# Patient Record
Sex: Female | Born: 1960 | Race: White | Hispanic: No | Marital: Single | Smoking: Never smoker
Health system: Southern US, Community
[De-identification: ages and names within clinical notes are randomized; demographics above are authoritative.]

## PROBLEM LIST (undated history)

## (undated) DIAGNOSIS — N3281 Overactive bladder: Secondary | ICD-10-CM

## (undated) DIAGNOSIS — M069 Rheumatoid arthritis, unspecified: Secondary | ICD-10-CM

## (undated) DIAGNOSIS — Z8719 Personal history of other diseases of the digestive system: Secondary | ICD-10-CM

## (undated) HISTORY — PX: ABDOMINAL HYSTERECTOMY: SHX81

## (undated) HISTORY — PX: CHOLECYSTECTOMY: SHX55

## (undated) HISTORY — DX: Rheumatoid arthritis, unspecified: M06.9

## (undated) HISTORY — DX: Personal history of other diseases of the digestive system: Z87.19

## (undated) HISTORY — DX: Overactive bladder: N32.81

---

## 2016-01-14 ENCOUNTER — Other Ambulatory Visit: Payer: Self-pay | Admitting: Rheumatology

## 2016-01-14 DIAGNOSIS — Z79899 Other long term (current) drug therapy: Secondary | ICD-10-CM

## 2016-01-15 LAB — CBC WITH DIFFERENTIAL/PLATELET
BASOS PCT: 1 %
Basophils Absolute: 64 cells/uL (ref 0–200)
EOS PCT: 3 %
Eosinophils Absolute: 192 cells/uL (ref 15–500)
HEMATOCRIT: 37.2 % (ref 35.0–45.0)
HEMOGLOBIN: 12.3 g/dL (ref 11.7–15.5)
LYMPHS ABS: 2240 {cells}/uL (ref 850–3900)
LYMPHS PCT: 35 %
MCH: 29.5 pg (ref 27.0–33.0)
MCHC: 33.1 g/dL (ref 32.0–36.0)
MCV: 89.2 fL (ref 80.0–100.0)
MONO ABS: 832 {cells}/uL (ref 200–950)
MPV: 11.7 fL (ref 7.5–12.5)
Monocytes Relative: 13 %
Neutro Abs: 3072 cells/uL (ref 1500–7800)
Neutrophils Relative %: 48 %
Platelets: 254 10*3/uL (ref 140–400)
RBC: 4.17 MIL/uL (ref 3.80–5.10)
RDW: 13.8 % (ref 11.0–15.0)
WBC: 6.4 10*3/uL (ref 3.8–10.8)

## 2016-01-15 LAB — COMPLETE METABOLIC PANEL WITH GFR
ALT: 15 U/L (ref 6–29)
AST: 24 U/L (ref 10–35)
Albumin: 3.8 g/dL (ref 3.6–5.1)
Alkaline Phosphatase: 77 U/L (ref 33–130)
BUN: 18 mg/dL (ref 7–25)
CALCIUM: 9.1 mg/dL (ref 8.6–10.4)
CHLORIDE: 107 mmol/L (ref 98–110)
CO2: 26 mmol/L (ref 20–31)
CREATININE: 0.84 mg/dL (ref 0.50–1.05)
GFR, Est African American: >89 mL/min (ref >=60)
GFR, Est Non African American: 78 mL/min (ref >=60)
GLUCOSE: 80 mg/dL (ref 65–99)
POTASSIUM: 4.6 mmol/L (ref 3.5–5.3)
SODIUM: 143 mmol/L (ref 135–146)
Total Bilirubin: 0.3 mg/dL (ref 0.2–1.2)
Total Protein: 6.3 g/dL (ref 6.1–8.1)

## 2016-01-17 ENCOUNTER — Telehealth: Payer: Self-pay | Admitting: Radiology

## 2016-01-17 NOTE — Telephone Encounter (Signed)
I have called patient to advise labs are normal  

## 2016-01-27 ENCOUNTER — Telehealth: Payer: Self-pay | Admitting: Rheumatology

## 2016-02-03 ENCOUNTER — Telehealth: Payer: Self-pay | Admitting: Rheumatology

## 2016-02-03 NOTE — Telephone Encounter (Signed)
[facility]  Called to confirm delivery of Euflexxa injections for patient for 02/09/16.

## 2016-02-08 ENCOUNTER — Other Ambulatory Visit: Payer: Self-pay | Admitting: Rheumatology

## 2016-02-08 NOTE — Telephone Encounter (Signed)
Last visit 11/11/15 Next visit 03/15/16 Ok to refill per Dr Corliss Skainseveshwar

## 2016-02-16 NOTE — Telephone Encounter (Signed)
Left message with pt's husband to have her call back

## 2016-02-22 NOTE — Telephone Encounter (Signed)
OPENED IN ERROR

## 2016-03-06 ENCOUNTER — Other Ambulatory Visit: Payer: Self-pay | Admitting: Rheumatology

## 2016-03-07 NOTE — Telephone Encounter (Signed)
Last Visit: 11/11/15 Next Visit due Dec 2017/Jan. 2018 Message given to front to schedule patient Labs: 01/17/16 WNL  Okay to refill Minocycline?

## 2016-03-15 ENCOUNTER — Ambulatory Visit: Payer: Self-pay | Admitting: Rheumatology

## 2016-03-22 ENCOUNTER — Ambulatory Visit (INDEPENDENT_AMBULATORY_CARE_PROVIDER_SITE_OTHER): Payer: BC Managed Care – PPO | Admitting: Rheumatology

## 2016-03-22 DIAGNOSIS — M170 Bilateral primary osteoarthritis of knee: Secondary | ICD-10-CM

## 2016-03-22 DIAGNOSIS — M25562 Pain in left knee: Secondary | ICD-10-CM

## 2016-03-22 DIAGNOSIS — M1711 Unilateral primary osteoarthritis, right knee: Secondary | ICD-10-CM

## 2016-03-22 DIAGNOSIS — G8929 Other chronic pain: Secondary | ICD-10-CM

## 2016-03-22 DIAGNOSIS — M1712 Unilateral primary osteoarthritis, left knee: Secondary | ICD-10-CM

## 2016-03-22 DIAGNOSIS — M25561 Pain in right knee: Secondary | ICD-10-CM

## 2016-03-22 MED ORDER — SODIUM HYALURONATE (VISCOSUP) 20 MG/2ML IX SOSY
20.0000 mg | PREFILLED_SYRINGE | INTRA_ARTICULAR | Status: AC | PRN
  Administered 2016-03-22: 20 mg via INTRA_ARTICULAR

## 2016-03-22 MED ORDER — LIDOCAINE HCL 1 % IJ SOLN
1.5000 mL | INTRAMUSCULAR | Status: AC | PRN
  Administered 2016-03-22: 1.5 mL

## 2016-03-22 NOTE — Progress Notes (Signed)
   Procedure Note  Patient: Cathy Casey             Date of Birth: 1960-07-13           MRN: [id number]             Visit Date: 03/22/2016  Procedures: Visit Diagnoses: Primary osteoarthritis of both knees  Bilateral chronic knee pain  Large Joint Inj Date/Time: 03/22/2016 4:08 PM Performed by: Tawni PummelPANWALA, Deyci Gesell Authorized by: Tawni PummelPANWALA, Nell Schrack   Consent Given by:  Patient Site marked: the procedure site was marked   Timeout: prior to procedure the correct patient, procedure, and site was verified   Indications:  Pain and joint swelling Location:  Knee Site:  R knee Prep: patient was prepped and draped in usual sterile fashion   Needle Size:  27 G Needle Length:  1.5 inches Approach:  Medial Ultrasound Guidance: No   Fluoroscopic Guidance: No   Arthrogram: No   Medications:  1.5 mL lidocaine 1 %; 20 mg Sodium Hyaluronate 20 MG/2ML Aspiration Attempted: Yes   Patient tolerance:  Patient tolerated the procedure well with no immediate complications Large Joint Inj Date/Time: 03/22/2016 4:10 PM Performed by: Tawni PummelPANWALA, Kristoff Coonradt Authorized by: Tawni PummelPANWALA, Blinda Turek   Consent Given by:  Patient Site marked: the procedure site was marked   Timeout: prior to procedure the correct patient, procedure, and site was verified   Indications:  Pain and joint swelling Location:  Knee Site:  L knee Prep: patient was prepped and draped in usual sterile fashion   Needle Size:  27 G Needle Length:  1.5 inches Approach:  Medial Ultrasound Guidance: No   Fluoroscopic Guidance: No   Arthrogram: No   Medications:  20 mg Sodium Hyaluronate 20 MG/2ML; 1.5 mL lidocaine 1 % Aspiration Attempted: Yes   Patient tolerance:  Patient tolerated the procedure well with no immediate complications  This is patient purchased Euflexxa No. 1 for both knees

## 2016-03-29 ENCOUNTER — Ambulatory Visit (INDEPENDENT_AMBULATORY_CARE_PROVIDER_SITE_OTHER): Payer: BC Managed Care – PPO | Admitting: Rheumatology

## 2016-03-29 DIAGNOSIS — M170 Bilateral primary osteoarthritis of knee: Secondary | ICD-10-CM

## 2016-03-29 DIAGNOSIS — M1711 Unilateral primary osteoarthritis, right knee: Secondary | ICD-10-CM

## 2016-03-29 DIAGNOSIS — G8929 Other chronic pain: Secondary | ICD-10-CM

## 2016-03-29 DIAGNOSIS — M25561 Pain in right knee: Secondary | ICD-10-CM | POA: Diagnosis not present

## 2016-03-29 DIAGNOSIS — M1712 Unilateral primary osteoarthritis, left knee: Secondary | ICD-10-CM

## 2016-03-29 DIAGNOSIS — M25562 Pain in left knee: Secondary | ICD-10-CM | POA: Diagnosis not present

## 2016-03-29 MED ORDER — SODIUM HYALURONATE (VISCOSUP) 20 MG/2ML IX SOSY
20.0000 mg | PREFILLED_SYRINGE | INTRA_ARTICULAR | Status: AC | PRN
  Administered 2016-03-29: 20 mg via INTRA_ARTICULAR

## 2016-03-29 MED ORDER — LIDOCAINE HCL 1 % IJ SOLN
1.5000 mL | INTRAMUSCULAR | Status: AC | PRN
  Administered 2016-03-29: 1.5 mL

## 2016-03-29 NOTE — Progress Notes (Addendum)
   Procedure Note  Patient: Cathy Casey             Date of Birth: Oct 01, 1960           MRN: [id number]             Visit Date: 03/29/2016  Procedures: Visit Diagnoses: No diagnosis found.  Large Joint Inj Date/Time: 03/29/2016 3:24 PM Performed by: Tawni PummelPANWALA, Courtny Bennison Authorized by: Tawni PummelPANWALA, Arieanna Pressey   Consent Given by:  Patient Site marked: the procedure site was marked   Timeout: prior to procedure the correct patient, procedure, and site was verified   Indications:  Pain and joint swelling Location:  Knee Site:  R knee Prep: patient was prepped and draped in usual sterile fashion   Needle Size:  27 G Needle Length:  1.5 inches Approach:  Medial Ultrasound Guidance: No   Fluoroscopic Guidance: No   Arthrogram: No   Medications:  20 mg Sodium Hyaluronate 20 MG/2ML; 1.5 mL lidocaine 1 % Aspiration Attempted: Yes   Aspirate amount (mL):  0 Patient tolerance:  Patient tolerated the procedure well with no immediate complications  Patient purchased; Euflexxa No. 2 Patient has a financial hardship and this medication was provided free of charge to the patient by the company.  She has been approved for one year. If she does well with this injection and she starts having pain 6 months from now, I've encouraged the patient to let us know so that we can give her another series of Euflexxa.  Patient's mother has gone to [facility] and received Hyalgan injections and did relatively well. If patient fails Euflexxa we may consider Hyalgan in the future.  Large Joint Inj Date/Time: 03/29/2016 3:25 PM Performed by: Tawni PummelPANWALA, Nolen Lindamood Authorized by: Tawni PummelPANWALA, Roel Douthat   Consent Given by:  Patient Site marked: the procedure site was marked   Timeout: prior to procedure the correct patient, procedure, and site was verified   Indications:  Pain and joint swelling Location:  Knee Site:  L knee Prep: patient was prepped and draped in usual sterile fashion   Needle Size:  27 G Needle Length:  1.5  inches Approach:  Medial Ultrasound Guidance: No   Fluoroscopic Guidance: No   Arthrogram: No   Medications:  20 mg Sodium Hyaluronate 20 MG/2ML; 1.5 mL lidocaine 1 % Aspiration Attempted: Yes   Aspirate amount (mL):  0 Patient tolerance:  Patient tolerated the procedure well with no immediate complications  Patient purchased; Euflexxa No. 2 Note: Patient has a financial hardship and this medication was provided free of charge of the patient by the company

## 2016-04-02 NOTE — Progress Notes (Signed)
Office Visit Note  Patient: Cathy Casey             Date of Birth: [birth year]           MRN: [id number]             PCP: Marco Collie, MD Referring: No ref. provider found Visit Date: 04/03/2016 Occupation: _0 @    Subjective:  No chief complaint on file. Follow-up on rheumatoid arthritis  History of Present Illness: Cathy Casey is a 56 y.o. female  Last seen 11/11/2015 for rheumatoid arthritis. Patient was on Plaquenil but could not afford it so we had to stop the Plaquenil and put her on minocycline 100 mg twice a day.  She is doing adequate with minocycline but currently she has to be on Myrbetriq. According to the patient she states that her physicians states that the minocycline is interfering with the Myrbetriq. She is requesting to see if she can be put on a different medication that her sister is on for rheumatoid arthritis and see if it is affordable for the patient.  She also history history of OA of the knee joint and she is currently getting Euflexxa. She's finished 2 injections to each knee and she is scheduled for one more injection the week after next.  Currently no joint pain stiffness or swelling with minocycline for her rheumatoid arthritis. Last visit in August 2017 she was also doing well.  She is due for CBC with differential CMP with GFR today  the last labs we have for the patient is from July 2017   Activities of Daily Living:  Patient reports morning stiffness for 15 minutes.   Patient Denies nocturnal pain.  Difficulty dressing/grooming: Denies Difficulty climbing stairs: Denies Difficulty getting out of chair: Denies Difficulty using hands for taps, buttons, cutlery, and/or writing: Denies   Review of Systems  Constitutional: Negative for fatigue.  HENT: Negative for mouth sores and mouth dryness.   Eyes: Negative for dryness.  Respiratory: Negative for shortness of breath.   Gastrointestinal: Negative for constipation and diarrhea.    Musculoskeletal: Negative for myalgias and myalgias.  Skin: Negative for sensitivity to sunlight.  Psychiatric/Behavioral: Negative for decreased concentration and sleep disturbance.    PMFS History:  Patient Active Problem List   Diagnosis Date Noted  . Rheumatoid arthritis with rheumatoid factor of multiple sites without organ or systems involvement ([place]) 04/03/2016  . High risk medications (not anticoagulants) long-term use 04/03/2016  . Primary osteoarthritis of both knees 03/29/2016  . Chronic pain of both knees 03/29/2016    Past Medical History:  Diagnosis Date  . Overactive bladder   . Rheumatoid arthritis ([place])     History reviewed. No pertinent family history. Past Surgical History:  Procedure Laterality Date  . ABDOMINAL HYSTERECTOMY    . CHOLECYSTECTOMY     Social History   Social History Narrative  . No narrative on file     Objective: Vital Signs: BP 126/65 (BP Location: Left Arm, Patient Position: Sitting, Cuff Size: Normal)   Pulse 67   Resp 12   Ht _1  (1.651 m)   Wt 183 lb (83 kg)   BMI 30.45 kg/m    Physical Exam  Constitutional: She is oriented to person, place, and time. She appears well-developed and well-nourished.  HENT:  Head: Normocephalic and atraumatic.  Eyes: EOM are normal. Pupils are equal, round, and reactive to light.  Cardiovascular: Normal rate, regular rhythm and normal heart sounds.  Exam reveals no gallop and  no friction rub.   No murmur heard. Pulmonary/Chest: Effort normal and breath sounds normal. She has no wheezes. She has no rales.  Abdominal: Soft. Bowel sounds are normal. She exhibits no distension. There is no tenderness. There is no guarding. No hernia.  Musculoskeletal: Normal range of motion. She exhibits no edema, tenderness or deformity.  Lymphadenopathy:    She has no cervical adenopathy.  Neurological: She is alert and oriented to person, place, and time. Coordination normal.  Skin: Skin is warm and dry.  Capillary refill takes less than 2 seconds. No rash noted.  Psychiatric: She has a normal mood and affect. Her behavior is normal.     Musculoskeletal Exam:    CDAI Exam: No CDAI exam completed.    Investigation: No additional findings.  Lab on 01/14/2016  Component Date Value Ref Range Status  . WBC 01/15/2016 6.4  3.8 - 10.8 K/uL Final  . RBC 01/15/2016 4.17  3.80 - 5.10 MIL/uL Final  . Hemoglobin 01/15/2016 12.3  11.7 - 15.5 g/dL Final  . HCT 01/15/2016 37.2  35.0 - 45.0 % Final  . MCV 01/15/2016 89.2  80.0 - 100.0 fL Final  . MCH 01/15/2016 29.5  27.0 - 33.0 pg Final  . MCHC 01/15/2016 33.1  32.0 - 36.0 g/dL Final  . RDW 01/15/2016 13.8  11.0 - 15.0 % Final  . Platelets 01/15/2016 254  140 - 400 K/uL Final  . MPV 01/15/2016 11.7  7.5 - 12.5 fL Final  . Neutro Abs 01/15/2016 3072  1,500 - 7,800 cells/uL Final  . Lymphs Abs 01/15/2016 2240  850 - 3,900 cells/uL Final  . Monocytes Absolute 01/15/2016 832  200 - 950 cells/uL Final  . Eosinophils Absolute 01/15/2016 192  15 - 500 cells/uL Final  . Basophils Absolute 01/15/2016 64  0 - 200 cells/uL Final  . Neutrophils Relative % 01/15/2016 48  % Final  . Lymphocytes Relative 01/15/2016 35  % Final  . Monocytes Relative 01/15/2016 13  % Final  . Eosinophils Relative 01/15/2016 3  % Final  . Basophils Relative 01/15/2016 1  % Final  . Smear Review 01/15/2016 Criteria for review not met   Final  . Sodium 01/15/2016 143  135 - 146 mmol/L Final  . Potassium 01/15/2016 4.6  3.5 - 5.3 mmol/L Final  . Chloride 01/15/2016 107  98 - 110 mmol/L Final  . CO2 01/15/2016 26  20 - 31 mmol/L Final  . Glucose, Bld 01/15/2016 80  65 - 99 mg/dL Final  . BUN 01/15/2016 18  7 - 25 mg/dL Final  . Creat 01/15/2016 0.84  0.50 - 1.05 mg/dL Final   Comment:   For patients > or = 56 years of age: The upper reference limit for Creatinine is approximately 13% higher for people identified as African-American.     . Total Bilirubin 01/15/2016 0.3   0.2 - 1.2 mg/dL Final  . Alkaline Phosphatase 01/15/2016 77  33 - 130 U/L Final  . AST 01/15/2016 24  10 - 35 U/L Final  . ALT 01/15/2016 15  6 - 29 U/L Final  . Total Protein 01/15/2016 6.3  6.1 - 8.1 g/dL Final  . Albumin 01/15/2016 3.8  3.6 - 5.1 g/dL Final  . Calcium 01/15/2016 9.1  8.6 - 10.4 mg/dL Final  . GFR, Est African American 01/15/2016 >89  >=60 mL/min Final  . GFR, Est Non African American 01/15/2016 78  >=60 mL/min Final     Imaging: Dg Chest 2 View  Result Date: 04/03/2016 CLINICAL DATA:  Pre treatment checkup for immunosuppression therapy EXAM: CHEST  2 VIEW COMPARISON:  None. FINDINGS: Cardiomediastinal silhouette is unremarkable. No infiltrate or pleural effusion. No pulmonary edema. Bony thorax is unremarkable. IMPRESSION: No active cardiopulmonary disease. Electronically Signed   By: Lahoma Crocker M.D.   On: 04/03/2016 11:27    Speciality Comments: No specialty comments available.    Procedures:  No procedures performed Allergies: Patient has no allergy information on record.   Assessment / Plan:     Visit Diagnoses: Primary osteoarthritis of both knees  Rheumatoid arthritis with rheumatoid factor of multiple sites without organ or systems involvement (HCC)  High risk medications (not anticoagulants) long-term use - minocycline100bid; plq(can'tafford); - Plan: CBC with Differential/Platelet, COMPLETE METABOLIC PANEL WITH GFR, DG Chest 2 View, HIV antibody  Chronic pain of both knees   Plan: #1: Rheumatoid arthritis. Doing well but patient states that the minocycline is interfering with her Myrbetriq. She would like to be put on methotrexate if possible.  #2: High-risk prescription. On minocycline 100 mg twice a day  #3: OA of the knee joint. Currently using Euflexxa with good results. Needs Euflexxa No. 3 bilateral knees to complete the series.  #4: CBC with differential, CMP with GFR today.  #5: Return to clinic in 4 months.  #6: Methotrexate 2.5 mg 3  pills every week; dispensed 36 pills with no refill Folic acid 1 mg daily ninety-day supply with 4 refills  #7: CBC with differential CMP with GFR in 1 month, then in 2 months, then we will see the patient a month after that.  #8: Schedule ultrasound bilateral hands and wrists. Last ultrasound was done April 2017. Patient has been on minocycline.  #9: Handout and consent on methotrexate. Dr. Estanislado Pandy is agreeable with switching the patient to methotrexate. Patient has an well with minocycline overall but due to having problems with Myrbetriq and minocycline, we will stop the minocycline and switch her to methotrexate.  #10: Please stopped minocycline when on methotrexate. Patient will need to take folic acid 1 mg every day ninety-day supply with 1 refill  Orders: Orders Placed This Encounter  Procedures  . DG Chest 2 View  . CBC with Differential/Platelet  . COMPLETE METABOLIC PANEL WITH GFR  . HIV antibody   Meds ordered this encounter  Medications  . methotrexate (RHEUMATREX) 2.5 MG tablet    Sig: Take 3 tablets (7.5 mg total) by mouth once a week. Caution:Chemotherapy. Protect from light.    Dispense:  36 tablet    Refill:  0    Order Specific Question:   Supervising Provider    Answer:   Bo Merino [[id number]]  . folic acid (FOLVITE) 1 MG tablet    Sig: Take 1 tablet (1 mg total) by mouth daily.    Dispense:  90 tablet    Refill:  1    Order Specific Question:   Supervising Provider    Answer:   Bo Merino [id number]    Face-to-face time spent with patient was 30 minutes. 50% of time was spent in counseling and coordination of care.  Follow-Up Instructions: Return in about 5 months (around 09/01/2016) for RA, Minocycline (stop); MTX 7.91m now; OA KJ, Knee pain.   NEliezer Lofts PA-C Patient continues to have pain stiffness and intermittent swelling in her hands. She had tenderness on examination over her MCPs and PIP joints. Not much synovitis was noted. Her  symptoms are not controlled with minocycline. She wants to start on  methotrexate. Indications CONTRAINDICATIONS were reviewed and we will start on methotrexate. We will also schedule an ultrasound examination of her bilateral hands to look for synovitis. I examined and evaluated the patient with Eliezer Lofts PA. The plan of care was discussed as noted above.  Bo Merino, MD Note - This record has been created using Editor, commissioning.  Chart creation errors have been sought, but may not always  have been located. Such creation errors do not reflect on  the standard of medical care.

## 2016-04-03 ENCOUNTER — Ambulatory Visit (HOSPITAL_COMMUNITY)
Admission: RE | Admit: 2016-04-03 | Discharge: 2016-04-03 | Disposition: A | Discharge status: Home / Self Care | Payer: BC Managed Care – PPO | Source: Ambulatory Visit | Attending: Rheumatology | Admitting: Rheumatology

## 2016-04-03 ENCOUNTER — Encounter: Payer: Self-pay | Admitting: Rheumatology

## 2016-04-03 ENCOUNTER — Ambulatory Visit (INDEPENDENT_AMBULATORY_CARE_PROVIDER_SITE_OTHER): Payer: BC Managed Care – PPO | Admitting: Rheumatology

## 2016-04-03 VITALS — BP 126/65 | HR 67 | Resp 12 | Ht 65.0 in | Wt 183.0 lb

## 2016-04-03 DIAGNOSIS — Z79899 Other long term (current) drug therapy: Secondary | ICD-10-CM

## 2016-04-03 DIAGNOSIS — M25561 Pain in right knee: Secondary | ICD-10-CM

## 2016-04-03 DIAGNOSIS — M170 Bilateral primary osteoarthritis of knee: Secondary | ICD-10-CM

## 2016-04-03 DIAGNOSIS — M0579 Rheumatoid arthritis with rheumatoid factor of multiple sites without organ or systems involvement: Secondary | ICD-10-CM | POA: Diagnosis not present

## 2016-04-03 DIAGNOSIS — Z01818 Encounter for other preprocedural examination: Secondary | ICD-10-CM | POA: Insufficient documentation

## 2016-04-03 DIAGNOSIS — M25562 Pain in left knee: Secondary | ICD-10-CM

## 2016-04-03 DIAGNOSIS — G8929 Other chronic pain: Secondary | ICD-10-CM

## 2016-04-03 LAB — CBC WITH DIFFERENTIAL/PLATELET
BASOS PCT: 1 %
Basophils Absolute: 51 cells/uL (ref 0–200)
EOS ABS: 102 {cells}/uL (ref 15–500)
Eosinophils Relative: 2 %
HEMATOCRIT: 38.9 % (ref 35.0–45.0)
HEMOGLOBIN: 12.8 g/dL (ref 11.7–15.5)
LYMPHS ABS: 1428 {cells}/uL (ref 850–3900)
Lymphocytes Relative: 28 %
MCH: 29.2 pg (ref 27.0–33.0)
MCHC: 32.9 g/dL (ref 32.0–36.0)
MCV: 88.6 fL (ref 80.0–100.0)
MONO ABS: 765 {cells}/uL (ref 200–950)
MPV: 10.8 fL (ref 7.5–12.5)
Monocytes Relative: 15 %
NEUTROS ABS: 2754 {cells}/uL (ref 1500–7800)
Neutrophils Relative %: 54 %
Platelets: 248 10*3/uL (ref 140–400)
RBC: 4.39 MIL/uL (ref 3.80–5.10)
RDW: 14 % (ref 11.0–15.0)
WBC: 5.1 10*3/uL (ref 3.8–10.8)

## 2016-04-03 LAB — COMPLETE METABOLIC PANEL WITH GFR
ALT: 18 U/L (ref 6–29)
AST: 23 U/L (ref 10–35)
Albumin: 3.8 g/dL (ref 3.6–5.1)
Alkaline Phosphatase: 85 U/L (ref 33–130)
BUN: 14 mg/dL (ref 7–25)
CALCIUM: 9.3 mg/dL (ref 8.6–10.4)
CHLORIDE: 107 mmol/L (ref 98–110)
CO2: 24 mmol/L (ref 20–31)
Creat: 0.73 mg/dL (ref 0.50–1.05)
GFR, Est African American: >89 mL/min (ref >=60)
GFR, Est Non African American: >89 mL/min (ref >=60)
Glucose, Bld: 82 mg/dL (ref 65–99)
POTASSIUM: 4.5 mmol/L (ref 3.5–5.3)
SODIUM: 141 mmol/L (ref 135–146)
Total Bilirubin: 0.4 mg/dL (ref 0.2–1.2)
Total Protein: 6.2 g/dL (ref 6.1–8.1)

## 2016-04-03 LAB — HIV ANTIBODY (ROUTINE TESTING W REFLEX): HIV: NONREACTIVE

## 2016-04-03 MED ORDER — FOLIC ACID 1 MG PO TABS: 1.0000 mg | ORAL_TABLET | Freq: Every day | ORAL | 1 refills | Status: DC

## 2016-04-03 MED ORDER — METHOTREXATE 2.5 MG PO TABS: 7.5000 mg | ORAL_TABLET | ORAL | 0 refills | Status: DC

## 2016-04-03 NOTE — Progress Notes (Signed)
Pharmacy Note Subjective: Patient presents today to the [facility] to see Dr. Gabrielle Dareeveshwar/Mr. Panwala.  Patient seen by the pharmacist for counseling on methotrexate.    Objective: CBC    Component Value Date/Time   WBC 6.4 01/14/2016 1502   RBC 4.17 01/14/2016 1502   HGB 12.3 01/14/2016 1502   HCT 37.2 01/14/2016 1502   PLT 254 01/14/2016 1502   MCV 89.2 01/14/2016 1502   MCH 29.5 01/14/2016 1502   MCHC 33.1 01/14/2016 1502   RDW 13.8 01/14/2016 1502   LYMPHSABS 2,240 01/14/2016 1502   MONOABS 832 01/14/2016 1502   EOSABS 192 01/14/2016 1502   BASOSABS 64 01/14/2016 1502    CMP     Component Value Date/Time   NA 143 01/14/2016 1502   K 4.6 01/14/2016 1502   CL 107 01/14/2016 1502   CO2 26 01/14/2016 1502   GLUCOSE 80 01/14/2016 1502   BUN 18 01/14/2016 1502   CREATININE 0.84 01/14/2016 1502   CALCIUM 9.1 01/14/2016 1502   PROT 6.3 01/14/2016 1502   ALBUMIN 3.8 01/14/2016 1502   AST 24 01/14/2016 1502   ALT 15 01/14/2016 1502   ALKPHOS 77 01/14/2016 1502   BILITOT 0.3 01/14/2016 1502   GFRNONAA 78 01/14/2016 1502   GFRAA >89 01/14/2016 1502    TB Gold: negative (06/24/13) Hepatitis panel: negative (06/24/13) HIV: Ordered today  Chest-xray:  Ordered today  Assessment/Plan:  Patient was initiated on methotrexate.  Patient was counseled on the purpose, proper use, and adverse effects of methotrexate including nausea, infection, and signs and symptoms of pneumonitis.  Reviewed instructions with patient to take methotrexate weekly along with folic acid daily.  Discussed the importance of frequent monitoring of kidney and liver function and blood counts.  Discussed importance of contraception while on methotrexate.  Patient confirmed she had a hysterectomy.  Counseled patient to avoid sulfa antibiotics such as Bactrim or Septra while on methotrexate.  Provided patient with educational materials on methotrexate and answered all questions.  Advised patient to get  annual influenza vaccine and to get a pneumococcal vaccine if patient has not already had one.  Patient voiced understanding.  Patient consented to methotrexate use.  Will upload into her chart.  Patient is aware that she should not start methotrexate until the results of her chest x-ray and labs are back.     Lilla Shookachel Henderson, Pharm.D., BCPS Clinical Pharmacist Pager: [phone] Phone: [phone] 04/03/2016 9:44 AM

## 2016-04-03 NOTE — Progress Notes (Signed)
Please tell patient that her chest x-ray is negativeBased on her chest x-ray we can move forward with her methotrexate.We will have to wait for other labs to result before we can start the methotrexate

## 2016-04-03 NOTE — Patient Instructions (Addendum)
Methotrexate tablets What is this medicine? METHOTREXATE (METH oh TREX ate) is a chemotherapy drug used to treat cancer including breast cancer, leukemia, and lymphoma. This medicine can also be used to treat psoriasis and certain kinds of arthritis. This medicine may be used for other purposes; ask your health care provider or pharmacist if you have questions. COMMON BRAND NAME(S): Rheumatrex, Trexall What should I tell my health care provider before I take this medicine? They need to know if you have any of these conditions: -fluid in the stomach area or lungs -if you often drink alcohol -infection or immune system problems -kidney disease or on hemodialysis -liver disease -low blood counts, like low white cell, platelet, or red cell counts -lung disease -radiation therapy -stomach ulcers -ulcerative colitis -an unusual or allergic reaction to methotrexate, other medicines, foods, dyes, or preservatives -pregnant or trying to get pregnant -breast-feeding How should I use this medicine? Take this medicine by mouth with a glass of water. Follow the directions on the prescription label. Take your medicine at regular intervals. Do not take it more often than directed. Do not stop taking except on your doctor's advice. Make sure you know why you are taking this medicine and how often you should take it. If this medicine is used for a condition that is not cancer, like arthritis or psoriasis, it should be taken weekly, NOT daily. Taking this medicine more often than directed can cause serious side effects, even death. Talk to your healthcare provider about safe handling and disposal of this medicine. You may need to take special precautions. Talk to your pediatrician regarding the use of this medicine in children. While this drug may be prescribed for selected conditions, precautions do apply. Overdosage: If you think you have taken too much of this medicine contact a poison control center or  emergency room at once. NOTE: This medicine is only for you. Do not share this medicine with others. What if I miss a dose? If you miss a dose, talk with your doctor or health care professional. Do not take double or extra doses. What may interact with this medicine? This medicine may interact with the following medication: -acitretin -aspirin and aspirin-like medicines including salicylates -azathioprine -certain antibiotics like penicillins, tetracycline, and chloramphenicol -cyclosporine -gold -hydroxychloroquine -live virus vaccines -NSAIDs, medicines for pain and inflammation, like ibuprofen or naproxen -other cytotoxic agents -penicillamine -phenylbutazone -phenytoin -probenecid -retinoids such as isotretinoin and tretinoin -steroid medicines like prednisone or cortisone -sulfonamides like sulfasalazine and trimethoprim/sulfamethoxazole -theophylline This list may not describe all possible interactions. Give your health care provider a list of all the medicines, herbs, non-prescription drugs, or dietary supplements you use. Also tell them if you smoke, drink alcohol, or use illegal drugs. Some items may interact with your medicine. What should I watch for while using this medicine? Avoid alcoholic drinks. This medicine can make you more sensitive to the sun. Keep out of the sun. If you cannot avoid being in the sun, wear protective clothing and use sunscreen. Do not use sun lamps or tanning beds/booths. You may need blood work done while you are taking this medicine. Call your doctor or health care professional for advice if you get a fever, chills or sore throat, or other symptoms of a cold or flu. Do not treat yourself. This drug decreases your body's ability to fight infections. Try to avoid being around people who are sick. This medicine may increase your risk to bruise or bleed. Call your doctor or health care professional   if you notice any unusual bleeding. Check with your  doctor or health care professional if you get an attack of severe diarrhea, nausea and vomiting, or if you sweat a lot. The loss of too much body fluid can make it dangerous for you to take this medicine. Talk to your doctor about your risk of cancer. You may be more at risk for certain types of cancers if you take this medicine. Both men and women must use effective birth control with this medicine. Do not become pregnant while taking this medicine or until at least 1 normal menstrual cycle has occurred after stopping it. Women should inform their doctor if they wish to become pregnant or think they might be pregnant. Men should not father a child while taking this medicine and for 3 months after stopping it. There is a potential for serious side effects to an unborn child. Talk to your health care professional or pharmacist for more information. Do not breast-feed an infant while taking this medicine. What side effects may I notice from receiving this medicine? Side effects that you should report to your doctor or health care professional as soon as possible: -allergic reactions like skin rash, itching or hives, swelling of the face, lips, or tongue -breathing problems or shortness of breath -diarrhea -dry, nonproductive cough -low blood counts - this medicine may decrease the number of white blood cells, red blood cells and platelets. You may be at increased risk for infections and bleeding. -mouth sores -redness, blistering, peeling or loosening of the skin, including inside the mouth -signs of infection - fever or chills, cough, sore throat, pain or trouble passing urine -signs and symptoms of bleeding such as bloody or black, tarry stools; red or dark-brown urine; spitting up blood or brown material that looks like coffee grounds; red spots on the skin; unusual bruising or bleeding from the eye, gums, or nose -signs and symptoms of kidney injury like trouble passing urine or change in the amount  of urine -signs and symptoms of liver injury like dark yellow or brown urine; general ill feeling or flu-like symptoms; light-colored stools; loss of appetite; nausea; right upper belly pain; unusually weak or tired; yellowing of the eyes or skin Side effects that usually do not require medical attention (report to your doctor or health care professional if they continue or are bothersome): -dizziness -hair loss -tiredness -upset stomach -vomiting This list may not describe all possible side effects. Call your doctor for medical advice about side effects. You may report side effects to FDA at 1-800-FDA-1088. Where should I keep my medicine? Keep out of the reach of children. Store at room temperature between 20 and 25 degrees C (68 and 77 degrees F). Protect from light. Throw away any unused medicine after the expiration date. NOTE: This sheet is a summary. It may not cover all possible information. If you have questions about this medicine, talk to your doctor, pharmacist, or health care provider.  2017 Elsevier/Gold Standard (2014-11-09 05:39:22)  

## 2016-04-04 NOTE — Progress Notes (Signed)
Tell patient#1: HIV negative#2: CMP with GFR normal#3: CBC with differential normal#4: Chest x-ray yesterday was also negative#5: Okay to start methotrexate once a week. And folic acid every dayStop minocyclineLabs will be due in 1 month after starting methotrexate and then every 2 months

## 2016-04-08 ENCOUNTER — Ambulatory Visit (INDEPENDENT_AMBULATORY_CARE_PROVIDER_SITE_OTHER): Payer: BC Managed Care – PPO | Admitting: Rheumatology

## 2016-04-08 DIAGNOSIS — M25562 Pain in left knee: Secondary | ICD-10-CM | POA: Diagnosis not present

## 2016-04-08 DIAGNOSIS — M1712 Unilateral primary osteoarthritis, left knee: Secondary | ICD-10-CM | POA: Diagnosis not present

## 2016-04-08 DIAGNOSIS — M1711 Unilateral primary osteoarthritis, right knee: Secondary | ICD-10-CM | POA: Diagnosis not present

## 2016-04-08 DIAGNOSIS — M25561 Pain in right knee: Secondary | ICD-10-CM

## 2016-04-08 DIAGNOSIS — M170 Bilateral primary osteoarthritis of knee: Secondary | ICD-10-CM

## 2016-04-08 DIAGNOSIS — G8929 Other chronic pain: Secondary | ICD-10-CM | POA: Diagnosis not present

## 2016-04-08 MED ORDER — LIDOCAINE HCL 1 % IJ SOLN
1.5000 mL | INTRAMUSCULAR | Status: AC | PRN
  Administered 2016-04-08: 1.5 mL

## 2016-04-08 MED ORDER — SODIUM HYALURONATE (VISCOSUP) 20 MG/2ML IX SOSY
20.0000 mg | PREFILLED_SYRINGE | INTRA_ARTICULAR | Status: AC | PRN
  Administered 2016-04-08: 20 mg via INTRA_ARTICULAR

## 2016-04-08 NOTE — Progress Notes (Signed)
   Procedure Note  Patient: Arlington CalixDebra Croucher             Date of Birth: [birth year]           MRN: [id number]             Visit Date: 04/08/2016  Procedures: Visit Diagnoses: Primary osteoarthritis of both knees  Bilateral chronic knee pain  Large Joint Inj Date/Time: 04/08/2016 9:38 AM Performed by: Tawni PummelPANWALA, Talaysha Freeberg Authorized by: Tawni PummelPANWALA, Cire Clute   Consent Given by:  Patient Site marked: the procedure site was marked   Timeout: prior to procedure the correct patient, procedure, and site was verified   Indications:  Pain Location:  Knee Site:  R knee Prep: patient was prepped and draped in usual sterile fashion   Needle Size:  27 G Needle Length:  1.5 inches Approach:  Medial Ultrasound Guidance: No   Fluoroscopic Guidance: No   Arthrogram: No   Medications:  1.5 mL lidocaine 1 % Aspiration Attempted: Yes   Patient tolerance:  Patient tolerated the procedure well with no immediate complications  This is third Euflexxa injection for the patient. She is tolerating the medication well. She states that her last 2 injections helped her some. I've asked her to reassess in 2 weeks after this third injection and decide if the 3 injections were overall helpful for her If she thinks she is benefiting from these medications, he can apply once again and restart the series 6 months from today. Large Joint Inj Date/Time: 04/08/2016 9:40 AM Performed by: Tawni PummelPANWALA, Brianny Soulliere Authorized by: Tawni PummelPANWALA, Milka Windholz   Consent Given by:  Patient Site marked: the procedure site was marked   Timeout: prior to procedure the correct patient, procedure, and site was verified   Indications:  Pain Location:  Knee Site:  L knee Prep: patient was prepped and draped in usual sterile fashion   Needle Size:  27 G Needle Length:  1.5 inches Approach:  Medial Ultrasound Guidance: No   Fluoroscopic Guidance: No   Arthrogram: No   Medications:  1.5 mL lidocaine 1 %; 20 mg Sodium Hyaluronate 20 MG/2ML Aspiration  Attempted: Yes   Patient tolerance:  Patient tolerated the procedure well with no immediate complications  This is third Euflexxa injection for the patient. She is tolerating the medication well. She states that her last 2 injections helped her some. I've asked her to reassess in 2 weeks after this third injection and decide if the 3 injections were overall helpful for her If she thinks she is benefiting from these medications, he can apply once again and restart the series 6 months from today.   Plan: #1: Return to clinic as scheduled for regular follow-up  #2:4 patient's RA, we started her on low-dose methotrexate. Her first injection was yesterday evening. She tolerated the medication well. There is no nausea vomiting or fatigue.  #3: I've asked the patient to assess how well the methotrexate is working for her over the next 2 months. She will right on her calendar her pain level from 0-10 on a weekly basis.  #4: As noted above she will assess how well her Euflexxa ejections are working for her. She'll assess the next 2 months and if appropriate she will make an appointment for repeat injections 6 months from today after getting a prior off.

## 2016-04-12 ENCOUNTER — Ambulatory Visit: Payer: Self-pay | Admitting: Rheumatology

## 2016-06-13 NOTE — Progress Notes (Signed)
  04/03/16 #1: Rheumatoid arthritis. Doing well but patient states that the minocycline is interfering with her Myrbetriq. She would like to be put on methotrexate if possible.  #2: High-risk prescription. On minocycline 100 mg twice a day  #3: OA of the knee joint. Currently using Euflexxa with good results. Needs Euflexxa No. 3 bilateral knees to complete the series.  #4: CBC with differential, CMP with GFR today.  #5: Return to clinic in 4 months.  #6: Methotrexate 2.5 mg 3 pills every week; dispensed 36 pills with no refill Folic acid 1 mg daily ninety-day supply with 4 refills  #7: CBC with differential CMP with GFR in 1 month, then in 2 months, then we will see the patient a month after that.  #8: Schedule ultrasound bilateral hands and wrists. Last ultrasound was done April 2017. Patient has been on minocycline.  #9: Handout and consent on methotrexate. Dr. Corliss Skainseveshwar is agreeable with switching the patient to methotrexate. Patient has an well with minocycline overall but due to having problems with Myrbetriq and minocycline, we will stop the minocycline and switch her to methotrexate.  #10: Please stopped minocycline when on methotrexate. Patient will need to take folic acid 1 mg every day ninety-day supply with 1 refill

## 2016-06-14 ENCOUNTER — Inpatient Hospital Stay (INDEPENDENT_AMBULATORY_CARE_PROVIDER_SITE_OTHER): Payer: BC Managed Care – PPO

## 2016-06-14 ENCOUNTER — Ambulatory Visit (INDEPENDENT_AMBULATORY_CARE_PROVIDER_SITE_OTHER): Payer: BC Managed Care – PPO | Admitting: Rheumatology

## 2016-06-14 ENCOUNTER — Other Ambulatory Visit: Payer: Self-pay | Admitting: *Deleted

## 2016-06-14 ENCOUNTER — Other Ambulatory Visit: Payer: Self-pay | Admitting: Rheumatology

## 2016-06-14 ENCOUNTER — Telehealth: Payer: Self-pay | Admitting: Radiology

## 2016-06-14 DIAGNOSIS — Z79899 Other long term (current) drug therapy: Secondary | ICD-10-CM

## 2016-06-14 DIAGNOSIS — M79641 Pain in right hand: Secondary | ICD-10-CM

## 2016-06-14 DIAGNOSIS — M79642 Pain in left hand: Secondary | ICD-10-CM | POA: Diagnosis not present

## 2016-06-14 NOTE — Telephone Encounter (Signed)
Last Visit: 04/03/16 Next visit: 09/01/16 Labs: 04/03/16 WNL  Okay to refill MTX?

## 2016-06-14 NOTE — Telephone Encounter (Signed)
Dr Corliss Skainseveshwar has asked us to apply for Euflexxa bilateral knees.  Patient aware this takes time.

## 2016-06-14 NOTE — Telephone Encounter (Signed)
ok 

## 2016-06-15 LAB — CBC WITH DIFFERENTIAL/PLATELET
BASOS ABS: 74 {cells}/uL (ref 0–200)
Basophils Relative: 1 %
EOS ABS: 148 {cells}/uL (ref 15–500)
Eosinophils Relative: 2 %
HCT: 36.4 % (ref 35.0–45.0)
Hemoglobin: 12.1 g/dL (ref 11.7–15.5)
Lymphocytes Relative: 28 %
Lymphs Abs: 2072 cells/uL (ref 850–3900)
MCH: 30.2 pg (ref 27.0–33.0)
MCHC: 33.2 g/dL (ref 32.0–36.0)
MCV: 90.8 fL (ref 80.0–100.0)
MONOS PCT: 13 %
MPV: 11.4 fL (ref 7.5–12.5)
Monocytes Absolute: 962 cells/uL — ABNORMAL HIGH (ref 200–950)
NEUTROS ABS: 4144 {cells}/uL (ref 1500–7800)
NEUTROS PCT: 56 %
Platelets: 301 10*3/uL (ref 140–400)
RBC: 4.01 MIL/uL (ref 3.80–5.10)
RDW: 14.3 % (ref 11.0–15.0)
WBC: 7.4 10*3/uL (ref 3.8–10.8)

## 2016-06-15 LAB — COMPLETE METABOLIC PANEL WITH GFR
ALBUMIN: 3.7 g/dL (ref 3.6–5.1)
ALK PHOS: 82 U/L (ref 33–130)
ALT: 25 U/L (ref 6–29)
AST: 25 U/L (ref 10–35)
BILIRUBIN TOTAL: 0.2 mg/dL (ref 0.2–1.2)
BUN: 14 mg/dL (ref 7–25)
CALCIUM: 8.9 mg/dL (ref 8.6–10.4)
CO2: 23 mmol/L (ref 20–31)
CREATININE: 0.84 mg/dL (ref 0.50–1.05)
Chloride: 108 mmol/L (ref 98–110)
GFR, Est Non African American: 78 mL/min (ref >=60)
Glucose, Bld: 95 mg/dL (ref 65–99)
Potassium: 4.4 mmol/L (ref 3.5–5.3)
Sodium: 142 mmol/L (ref 135–146)
TOTAL PROTEIN: 6.3 g/dL (ref 6.1–8.1)

## 2016-06-15 NOTE — Progress Notes (Signed)
WNL

## 2016-07-26 NOTE — Telephone Encounter (Signed)
new

## 2016-08-25 NOTE — Telephone Encounter (Signed)
Submitted online to Euflexxa, will followup.

## 2016-09-01 ENCOUNTER — Ambulatory Visit (INDEPENDENT_AMBULATORY_CARE_PROVIDER_SITE_OTHER): Payer: BC Managed Care – PPO | Admitting: Rheumatology

## 2016-09-01 ENCOUNTER — Encounter: Payer: Self-pay | Admitting: Rheumatology

## 2016-09-01 VITALS — BP 108/74 | HR 74 | Resp 16 | Wt 184.0 lb

## 2016-09-01 DIAGNOSIS — E669 Obesity, unspecified: Secondary | ICD-10-CM | POA: Diagnosis not present

## 2016-09-01 DIAGNOSIS — M25562 Pain in left knee: Secondary | ICD-10-CM | POA: Diagnosis not present

## 2016-09-01 DIAGNOSIS — Z79899 Other long term (current) drug therapy: Secondary | ICD-10-CM | POA: Diagnosis not present

## 2016-09-01 DIAGNOSIS — M25561 Pain in right knee: Secondary | ICD-10-CM

## 2016-09-01 DIAGNOSIS — G8929 Other chronic pain: Secondary | ICD-10-CM

## 2016-09-01 DIAGNOSIS — M0579 Rheumatoid arthritis with rheumatoid factor of multiple sites without organ or systems involvement: Secondary | ICD-10-CM | POA: Diagnosis not present

## 2016-09-01 DIAGNOSIS — Z6830 Body mass index (BMI) 30.0-30.9, adult: Secondary | ICD-10-CM | POA: Diagnosis not present

## 2016-09-01 DIAGNOSIS — M170 Bilateral primary osteoarthritis of knee: Secondary | ICD-10-CM

## 2016-09-01 LAB — COMPLETE METABOLIC PANEL WITH GFR
ALK PHOS: 96 U/L (ref 33–130)
ALT: 28 U/L (ref 6–29)
AST: 26 U/L (ref 10–35)
Albumin: 4.1 g/dL (ref 3.6–5.1)
BUN: 10 mg/dL (ref 7–25)
CHLORIDE: 106 mmol/L (ref 98–110)
CO2: 23 mmol/L (ref 20–31)
Calcium: 9.4 mg/dL (ref 8.6–10.4)
Creat: 0.87 mg/dL (ref 0.50–1.05)
GFR, EST NON AFRICAN AMERICAN: 75 mL/min (ref >=60)
GFR, Est African American: 87 mL/min (ref >=60)
GLUCOSE: 90 mg/dL (ref 65–99)
POTASSIUM: 5.3 mmol/L (ref 3.5–5.3)
SODIUM: 141 mmol/L (ref 135–146)
Total Bilirubin: 0.4 mg/dL (ref 0.2–1.2)
Total Protein: 6.4 g/dL (ref 6.1–8.1)

## 2016-09-01 LAB — CBC WITH DIFFERENTIAL/PLATELET
Basophils Absolute: 0 cells/uL (ref 0–200)
Basophils Relative: 0 %
Eosinophils Absolute: 132 cells/uL (ref 15–500)
Eosinophils Relative: 2 %
HCT: 40 % (ref 35.0–45.0)
Hemoglobin: 12.8 g/dL (ref 11.7–15.5)
Lymphocytes Relative: 32 %
Lymphs Abs: 2112 cells/uL (ref 850–3900)
MCH: 29.4 pg (ref 27.0–33.0)
MCHC: 32 g/dL (ref 32.0–36.0)
MCV: 91.7 fL (ref 80.0–100.0)
MPV: 10.6 fL (ref 7.5–12.5)
Monocytes Absolute: 924 cells/uL (ref 200–950)
Monocytes Relative: 14 %
Neutro Abs: 3432 cells/uL (ref 1500–7800)
Neutrophils Relative %: 52 %
Platelets: 301 10*3/uL (ref 140–400)
RBC: 4.36 MIL/uL (ref 3.80–5.10)
RDW: 14.2 % (ref 11.0–15.0)
WBC: 6.6 10*3/uL (ref 3.8–10.8)

## 2016-09-01 MED ORDER — FOLIC ACID 1 MG PO TABS: 1.0000 mg | ORAL_TABLET | Freq: Every day | ORAL | 4 refills | Status: DC

## 2016-09-01 MED ORDER — METHOTREXATE 2.5 MG PO TABS: ORAL_TABLET | ORAL | 0 refills | Status: DC

## 2016-09-01 NOTE — Progress Notes (Signed)
Office Visit Note  Patient: Cathy Casey             Date of Birth: May 17, 1960           MRN: [id number]             PCP: Marco Collie, MD Referring: Marco Collie, MD Visit Date: 09/01/2016 Occupation: '@GUAROCC'$ @    Subjective:  Medication Management   History of Present Illness: Cathy Casey is a 56 y.o. female  Last seen in our office in March by Dr. Estanislado Pandy for ultrasound of bilateral hands. And in January for rheumatoid arthritis.  Asian is on a low dose of methotrexate and doing well. We had to switch her from Plaquenil to minocycline because she could not afford the Plaquenil. We had to stop the minocycline because it interfered with her myrbetriq and switched her to methotrexate. Since then, she's been doing well with her methotrexate. She takes 3 pills per week and folic acid 1 mg daily. No joint pain, stiffness, swelling.  Her morning stiffness is less than 10 minutes.  Activities of Daily Living:  Patient reports morning stiffness for 15 minutes.   Patient Denies nocturnal pain.  Difficulty dressing/grooming: Denies Difficulty climbing stairs: Denies Difficulty getting out of chair: Denies Difficulty using hands for taps, buttons, cutlery, and/or writing: Denies   No Rheumatology ROS completed.   PMFS History:  Patient Active Problem List   Diagnosis Date Noted  . Rheumatoid arthritis with rheumatoid factor of multiple sites without organ or systems involvement ([place]) 04/03/2016  . High risk medications (not anticoagulants) long-term use 04/03/2016  . Primary osteoarthritis of both knees 03/29/2016  . Chronic pain of both knees 03/29/2016    Past Medical History:  Diagnosis Date  . Overactive bladder   . Rheumatoid arthritis ([place])     No family history on file. Past Surgical History:  Procedure Laterality Date  . ABDOMINAL HYSTERECTOMY    . CHOLECYSTECTOMY     Social History   Social History Narrative  . No narrative on file      Objective: Vital Signs: BP 108/74   Pulse 74   Resp 16   Wt 184 lb (83.5 kg)   BMI 30.62 kg/m    Physical Exam   Musculoskeletal Exam:  Full range of motion of all joints Grip strength is equal and strong bilaterally Fiber myalgia tender points are all absent  CDAI Exam: No CDAI exam completed.  No synovitis on examination  Investigation: No additional findings. Orders Only on 06/14/2016  Component Date Value Ref Range Status  . WBC 06/14/2016 7.4  3.8 - 10.8 K/uL Final  . RBC 06/14/2016 4.01  3.80 - 5.10 MIL/uL Final  . Hemoglobin 06/14/2016 12.1  11.7 - 15.5 g/dL Final  . HCT 06/14/2016 36.4  35.0 - 45.0 % Final  . MCV 06/14/2016 90.8  80.0 - 100.0 fL Final  . MCH 06/14/2016 30.2  27.0 - 33.0 pg Final  . MCHC 06/14/2016 33.2  32.0 - 36.0 g/dL Final  . RDW 06/14/2016 14.3  11.0 - 15.0 % Final  . Platelets 06/14/2016 301  140 - 400 K/uL Final  . MPV 06/14/2016 11.4  7.5 - 12.5 fL Final  . Neutro Abs 06/14/2016 4144  1,500 - 7,800 cells/uL Final  . Lymphs Abs 06/14/2016 2072  850 - 3,900 cells/uL Final  . Monocytes Absolute 06/14/2016 962* 200 - 950 cells/uL Final  . Eosinophils Absolute 06/14/2016 148  15 - 500 cells/uL Final  . Basophils Absolute  06/14/2016 74  0 - 200 cells/uL Final  . Neutrophils Relative % 06/14/2016 56  % Final  . Lymphocytes Relative 06/14/2016 28  % Final  . Monocytes Relative 06/14/2016 13  % Final  . Eosinophils Relative 06/14/2016 2  % Final  . Basophils Relative 06/14/2016 1  % Final  . Smear Review 06/14/2016 Criteria for review not met   Final  . Sodium 06/14/2016 142  135 - 146 mmol/L Final  . Potassium 06/14/2016 4.4  3.5 - 5.3 mmol/L Final  . Chloride 06/14/2016 108  98 - 110 mmol/L Final  . CO2 06/14/2016 23  20 - 31 mmol/L Final  . Glucose, Bld 06/14/2016 95  65 - 99 mg/dL Final  . BUN 50/38/8828 14  7 - 25 mg/dL Final  . Creat 00/34/9179 0.84  0.50 - 1.05 mg/dL Final   Comment:   For patients > or = 56 years of age: The  upper reference limit for Creatinine is approximately 13% higher for people identified as African-American.     . Total Bilirubin 06/14/2016 0.2  0.2 - 1.2 mg/dL Final  . Alkaline Phosphatase 06/14/2016 82  33 - 130 U/L Final  . AST 06/14/2016 25  10 - 35 U/L Final  . ALT 06/14/2016 25  6 - 29 U/L Final  . Total Protein 06/14/2016 6.3  6.1 - 8.1 g/dL Final  . Albumin 15/07/6977 3.7  3.6 - 5.1 g/dL Final  . Calcium 48/03/6551 8.9  8.6 - 10.4 mg/dL Final  . GFR, Est African American 06/14/2016 >89  >=60 mL/min Final  . GFR, Est Non African American 06/14/2016 78  >=60 mL/min Final  Office Visit on 04/03/2016  Component Date Value Ref Range Status  . WBC 04/03/2016 5.1  3.8 - 10.8 K/uL Final  . RBC 04/03/2016 4.39  3.80 - 5.10 MIL/uL Final  . Hemoglobin 04/03/2016 12.8  11.7 - 15.5 g/dL Final  . HCT 74/82/7078 38.9  35.0 - 45.0 % Final  . MCV 04/03/2016 88.6  80.0 - 100.0 fL Final  . MCH 04/03/2016 29.2  27.0 - 33.0 pg Final  . MCHC 04/03/2016 32.9  32.0 - 36.0 g/dL Final  . RDW 67/54/4920 14.0  11.0 - 15.0 % Final  . Platelets 04/03/2016 248  140 - 400 K/uL Final  . MPV 04/03/2016 10.8  7.5 - 12.5 fL Final  . Neutro Abs 04/03/2016 2754  1,500 - 7,800 cells/uL Final  . Lymphs Abs 04/03/2016 1428  850 - 3,900 cells/uL Final  . Monocytes Absolute 04/03/2016 765  200 - 950 cells/uL Final  . Eosinophils Absolute 04/03/2016 102  15 - 500 cells/uL Final  . Basophils Absolute 04/03/2016 51  0 - 200 cells/uL Final  . Neutrophils Relative % 04/03/2016 54  % Final  . Lymphocytes Relative 04/03/2016 28  % Final  . Monocytes Relative 04/03/2016 15  % Final  . Eosinophils Relative 04/03/2016 2  % Final  . Basophils Relative 04/03/2016 1  % Final  . Smear Review 04/03/2016 Criteria for review not met   Final  . Sodium 04/03/2016 141  135 - 146 mmol/L Final  . Potassium 04/03/2016 4.5  3.5 - 5.3 mmol/L Final  . Chloride 04/03/2016 107  98 - 110 mmol/L Final  . CO2 04/03/2016 24  20 - 31 mmol/L  Final  . Glucose, Bld 04/03/2016 82  65 - 99 mg/dL Final  . BUN 12/24/1217 14  7 - 25 mg/dL Final  . Creat 75/88/3254 0.73  0.50 -  1.05 mg/dL Final   Comment:   For patients > or = 56 years of age: The upper reference limit for Creatinine is approximately 13% higher for people identified as African-American.     . Total Bilirubin 04/03/2016 0.4  0.2 - 1.2 mg/dL Final  . Alkaline Phosphatase 04/03/2016 85  33 - 130 U/L Final  . AST 04/03/2016 23  10 - 35 U/L Final  . ALT 04/03/2016 18  6 - 29 U/L Final  . Total Protein 04/03/2016 6.2  6.1 - 8.1 g/dL Final  . Albumin 04/03/2016 3.8  3.6 - 5.1 g/dL Final  . Calcium 04/03/2016 9.3  8.6 - 10.4 mg/dL Final  . GFR, Est African American 04/03/2016 >89  >=60 mL/min Final  . GFR, Est Non African American 04/03/2016 >89  >=60 mL/min Final  . HIV 1&2 Ab, 4th Generation 04/03/2016 NONREACTIVE  NONREACTIVE Final   Comment:   HIV-1 antigen and HIV-1/HIV-2 antibodies were not detected.  There is no laboratory evidence of HIV infection.   HIV-1/2 Antibody Diff        Not indicated. HIV-1 RNA, Qual TMA          Not indicated.     PLEASE NOTE: This information has been disclosed to you from records whose confidentiality may be protected by state law. If your state requires such protection, then the state law prohibits you from making any further disclosure of the information without the specific written consent of the person to whom it pertains, or as otherwise permitted by law. A general authorization for the release of medical or other information is NOT sufficient for this purpose.   The performance of this assay has not been clinically validated in patients less than 89 years old.   For additional information please refer to http://education.[facility].com/faq/FAQ106.  (This link is being provided for informational/educational purposes only.)        Imaging: No results found.  Speciality Comments: No specialty comments  available.    Procedures:  No procedures performed Allergies: Patient has no known allergies.   Assessment / Plan:     Visit Diagnoses: Rheumatoid arthritis with rheumatoid factor of multiple sites without organ or systems involvement ([place])  High risk medications (not anticoagulants) long-term use - Plan: CBC with Differential/Platelet, COMPLETE METABOLIC PANEL WITH GFR  Class 1 obesity without serious comorbidity with body mass index (BMI) of 30.0 to 30.9 in adult, unspecified obesity type  Primary osteoarthritis of both knees  Chronic pain of both knees   Plan: #1: Rheumatoid arthritis. Doing well. No joint pain, swelling, stiffness. Patient is tolerating methotrexate 3 pills every week well. Enthesis takes it every Friday night). She takes folic acid 1 mg daily. Morning stiffness is less than 10 minutes.  #2: High risk prescription. Patient gets her labs done every 3 months and the last Mohs within normal limits. On methotrexate 3 per week every Friday On folic acid 1 mg daily Morning stiffness less than 10 minutes  #3: Bilateral knee OA.  #4: Bilateral knee pain We have given the patient Euflexxa 3 to both knees and the third injection was completed on 04/08/2016. Patient is doing well but she would like to continue the good results by getting another prior authorization approved so she can start her treatment after 10/07/2016. I will send a message for prior authorization for Euflexxa 3 both knees so she can be scheduled after 10/07/2016.  #5: Obesity. She has a BMI of about 30. She is getting SAXENDA from her  PCP and had lost about 40 pounds. They decrease her dose and she gained some of her weight back. She restarted at her previous dose and is looking forward to weight loss. She recognizes that sometime she has poor food choices including eating 2 gallons of ice cream per week. She continues to work hard at weight loss by doing exercises 3 times a week at  [employer] with her sister.  #6: CBC with differential, CMP with GFR in office today.  #7: Refill methotrexate and folic acid  #8: Return to clinic in 5 months  Orders: Orders Placed This Encounter  Procedures  . CBC with Differential/Platelet  . COMPLETE METABOLIC PANEL WITH GFR   Meds ordered this encounter  Medications  . folic acid (FOLVITE) 1 MG tablet    Sig: Take 1 tablet (1 mg total) by mouth daily.    Dispense:  90 tablet    Refill:  4    Order Specific Question:   Supervising Provider    Answer:   Bo Merino [[id number]]  . methotrexate (RHEUMATREX) 2.5 MG tablet    Sig: TAKE 3 TABLETS BY MOUTH ONCE WEEKLY. PROTECT FROM LIGHT    Dispense:  36 tablet    Refill:  0    Order Specific Question:   Supervising Provider    Answer:   Bo Merino [[id number]]    Face-to-face time spent with patient was 30 minutes. 50% of time was spent in counseling and coordination of care.  Follow-Up Instructions: Return in about 5 months (around 02/01/2017) for RA, mtx 3/wk, folic '1mg'$  qd (adeq response), bil knee jt pain, oa kj, .   Eliezer Lofts, PA-C  Note - This record has been created using [employer].  Chart creation errors have been sought, but may not always  have been located. Such creation errors do not reflect on  the standard of medical care.

## 2016-09-01 NOTE — Telephone Encounter (Signed)
Patient will have an $85 copay each visit for Euflexxa injections, ok to call her and schedule if she wants.  Beyond the $85 copay, her insurance covers it at 100%.  This is for buy and bill, bilateral knees, thanks.

## 2016-10-16 ENCOUNTER — Encounter: Payer: Self-pay | Admitting: Rheumatology

## 2016-10-16 ENCOUNTER — Ambulatory Visit (INDEPENDENT_AMBULATORY_CARE_PROVIDER_SITE_OTHER): Payer: BC Managed Care – PPO | Admitting: Rheumatology

## 2016-10-16 VITALS — Ht 65.0 in | Wt 174.0 lb

## 2016-10-16 DIAGNOSIS — M1711 Unilateral primary osteoarthritis, right knee: Secondary | ICD-10-CM | POA: Diagnosis not present

## 2016-10-16 DIAGNOSIS — M1712 Unilateral primary osteoarthritis, left knee: Secondary | ICD-10-CM

## 2016-10-16 DIAGNOSIS — M170 Bilateral primary osteoarthritis of knee: Secondary | ICD-10-CM

## 2016-10-16 MED ORDER — SODIUM HYALURONATE (VISCOSUP) 20 MG/2ML IX SOSY
20.0000 mg | PREFILLED_SYRINGE | INTRA_ARTICULAR | Status: AC | PRN
  Administered 2016-10-16: 20 mg via INTRA_ARTICULAR

## 2016-10-16 MED ORDER — LIDOCAINE HCL 2 % IJ SOLN
1.5000 mL | INTRAMUSCULAR | Status: AC | PRN
  Administered 2016-10-16: 1.5 mL

## 2016-10-16 NOTE — Progress Notes (Signed)
   Procedure Note  Patient: Cathy Casey             Date of Birth: Sep 14, 1960           MRN: [id number]             Visit Date: 10/16/2016  Procedures: Visit Diagnoses: Bilateral primary osteoarthritis of knee - Plan: Large Joint Injection/Arthrocentesis, Large Joint Injection/Arthrocentesis  Large Joint Inj Date/Time: 10/16/2016 12:48 PM Performed by: Pollyann SavoyEVESHWAR, Jahmai Finelli Authorized by: Pollyann SavoyEVESHWAR, Andrus Sharp   Consent Given by:  Patient Site marked: the procedure site was marked   Timeout: prior to procedure the correct patient, procedure, and site was verified   Indications:  Pain Location:  Knee Site:  R knee Prep: patient was prepped and draped in usual sterile fashion   Needle Size:  27 G Needle Length:  1.5 inches Ultrasound Guidance: No   Fluoroscopic Guidance: No   Arthrogram: No   Medications:  1.5 mL lidocaine 2 %; 20 mg Sodium Hyaluronate 20 MG/2ML Aspiration Attempted: Yes   Patient tolerance:  Patient tolerated the procedure well with no immediate complications Large Joint Inj Date/Time: 10/16/2016 12:48 PM Performed by: Pollyann SavoyEVESHWAR, Tanis Hensarling Authorized by: Pollyann SavoyEVESHWAR, Rodolfo Gaster   Consent Given by:  Patient Site marked: the procedure site was marked   Timeout: prior to procedure the correct patient, procedure, and site was verified   Indications:  Pain Location:  Knee Site:  L knee Prep: patient was prepped and draped in usual sterile fashion   Needle Size:  27 G Needle Length:  1.5 inches Ultrasound Guidance: No   Fluoroscopic Guidance: No   Arthrogram: No   Medications:  1.5 mL lidocaine 2 %; 20 mg Sodium Hyaluronate 20 MG/2ML Aspiration Attempted: Yes   Patient tolerance:  Patient tolerated the procedure well with no immediate complications   Pollyann SavoyShaili Rosabell Geyer, MD

## 2016-10-17 NOTE — Progress Notes (Signed)
   Procedure Note  Patient: Cathy Casey             Date of Birth: 07/30/1960           MRN: [id number]             Visit Date: 10/23/2016  Procedures: Visit Diagnoses: Primary osteoarthritis of both knees Euflexxa #2 Bilateral knees buy and bill   Large Joint Inj Date/Time: 10/23/2016 12:04 PM Performed by: Pollyann SavoyEVESHWAR, Demetri Goshert Authorized by: Pollyann SavoyEVESHWAR, Thaxton Pelley   Consent Given by:  Patient Site marked: the procedure site was marked   Timeout: prior to procedure the correct patient, procedure, and site was verified   Indications:  Pain and joint swelling Location:  Knee Site:  R knee Prep: patient was prepped and draped in usual sterile fashion   Needle Size:  27 G Needle Length:  1.5 inches Approach:  Medial Ultrasound Guidance: No   Fluoroscopic Guidance: No   Arthrogram: No   Medications:  1.5 mL lidocaine 1 %; 20 mg Sodium Hyaluronate 20 MG/2ML Aspiration Attempted: Yes   Aspirate amount (mL):  0 Patient tolerance:  Patient tolerated the procedure well with no immediate complications Large Joint Inj Date/Time: 10/23/2016 12:04 PM Performed by: Pollyann SavoyEVESHWAR, Victorina Kable Authorized by: Pollyann SavoyEVESHWAR, Yonas Bunda   Consent Given by:  Patient Site marked: the procedure site was marked   Timeout: prior to procedure the correct patient, procedure, and site was verified   Indications:  Pain and joint swelling Location:  Knee Site:  L knee Prep: patient was prepped and draped in usual sterile fashion   Needle Size:  27 G Needle Length:  1.5 inches Approach:  Medial Ultrasound Guidance: No   Fluoroscopic Guidance: No   Arthrogram: No   Medications:  1.5 mL lidocaine 1 %; 20 mg Sodium Hyaluronate 20 MG/2ML Aspiration Attempted: Yes   Aspirate amount (mL):  0 Patient tolerance:  Patient tolerated the procedure well with no immediate complications   Pollyann SavoyShaili Cordae Mccarey, MD

## 2016-10-23 ENCOUNTER — Ambulatory Visit (INDEPENDENT_AMBULATORY_CARE_PROVIDER_SITE_OTHER): Payer: BC Managed Care – PPO | Admitting: Rheumatology

## 2016-10-23 DIAGNOSIS — M170 Bilateral primary osteoarthritis of knee: Secondary | ICD-10-CM

## 2016-10-23 DIAGNOSIS — M1712 Unilateral primary osteoarthritis, left knee: Secondary | ICD-10-CM | POA: Diagnosis not present

## 2016-10-23 DIAGNOSIS — M1711 Unilateral primary osteoarthritis, right knee: Secondary | ICD-10-CM | POA: Diagnosis not present

## 2016-10-23 MED ORDER — SODIUM HYALURONATE (VISCOSUP) 20 MG/2ML IX SOSY
20.0000 mg | PREFILLED_SYRINGE | INTRA_ARTICULAR | Status: AC | PRN
  Administered 2016-10-23: 20 mg via INTRA_ARTICULAR

## 2016-10-23 MED ORDER — LIDOCAINE HCL 1 % IJ SOLN
1.5000 mL | INTRAMUSCULAR | Status: AC | PRN
  Administered 2016-10-23: 1.5 mL

## 2016-10-24 NOTE — Progress Notes (Signed)
   Procedure Note  Patient: Cathy Casey             Date of Birth: 1960/08/05           MRN: [id number]             Visit Date: 10/30/2016  Procedures: Visit Diagnoses: Primary osteoarthritis of both knees Euflexxa #3 bilateral knees  Large Joint Inj Date/Time: 10/30/2016 12:37 PM Performed by: Pollyann SavoyEVESHWAR, Aryaa Bunting Authorized by: Pollyann SavoyEVESHWAR, Ikesha Siller   Consent Given by:  Patient Site marked: the procedure site was marked   Timeout: prior to procedure the correct patient, procedure, and site was verified   Indications:  Pain and joint swelling Location:  Knee Site:  R knee Prep: patient was prepped and draped in usual sterile fashion   Needle Size:  27 G Needle Length:  1.5 inches Approach:  Medial Ultrasound Guidance: No   Fluoroscopic Guidance: No   Arthrogram: No   Medications:  1.5 mL lidocaine 1 %; 20 mg Sodium Hyaluronate 20 MG/2ML Aspiration Attempted: Yes   Aspirate amount (mL):  0 Patient tolerance:  Patient tolerated the procedure well with no immediate complications Large Joint Inj Date/Time: 10/30/2016 12:37 PM Performed by: Pollyann SavoyEVESHWAR, Shaakira Borrero Authorized by: Pollyann SavoyEVESHWAR, Raenah Murley   Consent Given by:  Patient Site marked: the procedure site was marked   Timeout: prior to procedure the correct patient, procedure, and site was verified   Indications:  Pain and joint swelling Location:  Knee Site:  L knee Prep: patient was prepped and draped in usual sterile fashion   Needle Size:  27 G Needle Length:  1.5 inches Approach:  Medial Ultrasound Guidance: No   Fluoroscopic Guidance: No   Arthrogram: No   Medications:  1.5 mL lidocaine 1 %; 20 mg Sodium Hyaluronate 20 MG/2ML Aspiration Attempted: Yes   Aspirate amount (mL):  0 Patient tolerance:  Patient tolerated the procedure well with no immediate complications   Pollyann SavoyShaili Dayvin Aber, MD

## 2016-10-30 ENCOUNTER — Ambulatory Visit (INDEPENDENT_AMBULATORY_CARE_PROVIDER_SITE_OTHER): Payer: BC Managed Care – PPO | Admitting: Rheumatology

## 2016-10-30 DIAGNOSIS — M1711 Unilateral primary osteoarthritis, right knee: Secondary | ICD-10-CM | POA: Diagnosis not present

## 2016-10-30 DIAGNOSIS — M1712 Unilateral primary osteoarthritis, left knee: Secondary | ICD-10-CM | POA: Diagnosis not present

## 2016-10-30 DIAGNOSIS — M170 Bilateral primary osteoarthritis of knee: Secondary | ICD-10-CM

## 2016-10-30 MED ORDER — SODIUM HYALURONATE (VISCOSUP) 20 MG/2ML IX SOSY
20.0000 mg | PREFILLED_SYRINGE | INTRA_ARTICULAR | Status: AC | PRN
  Administered 2016-10-30: 20 mg via INTRA_ARTICULAR

## 2016-10-30 MED ORDER — LIDOCAINE HCL 1 % IJ SOLN
1.5000 mL | INTRAMUSCULAR | Status: AC | PRN
  Administered 2016-10-30: 1.5 mL

## 2016-11-27 ENCOUNTER — Other Ambulatory Visit: Payer: Self-pay | Admitting: Rheumatology

## 2016-11-27 NOTE — Telephone Encounter (Addendum)
Last Visit: 09/01/16 Next Visit: 1212/18 Labs: 09/01/16 WNL  Left message for patient to call the office. Patient is due to update labs this month.   Okay to refill  per Dr. Corliss Skainseveshwar

## 2017-02-06 ENCOUNTER — Ambulatory Visit: Payer: BC Managed Care – PPO | Admitting: Rheumatology

## 2017-02-17 NOTE — Progress Notes (Deleted)
Office Visit Note  Patient: Cathy Casey             Date of Birth: 12-31-1960           MRN: [id number]             PCP: Abner GreenspanHodges, Beth, MD Referring: Abner GreenspanHodges, Beth, MD Visit Date: 02/28/2017 Occupation: @GUAROCC @    Subjective:  No chief complaint on file.   History of Present Illness: Cathy Casey is a 56 y.o. female ***   Activities of Daily Living:  Patient reports morning stiffness for *** {minute/hour:19697}.   Patient {ACTIONS;DENIES/REPORTS:21021675::"Denies"} nocturnal pain.  Difficulty dressing/grooming: {ACTIONS;DENIES/REPORTS:21021675::"Denies"} Difficulty climbing stairs: {ACTIONS;DENIES/REPORTS:21021675::"Denies"} Difficulty getting out of chair: {ACTIONS;DENIES/REPORTS:21021675::"Denies"} Difficulty using hands for taps, buttons, cutlery, and/or writing: {ACTIONS;DENIES/REPORTS:21021675::"Denies"}   No Rheumatology ROS completed.   PMFS History:  Patient Active Problem List   Diagnosis Date Noted  . Rheumatoid arthritis with rheumatoid factor of multiple sites without organ or systems involvement (HCC) 04/03/2016  . High risk medications (not anticoagulants) long-term use 04/03/2016  . Primary osteoarthritis of both knees 03/29/2016  . Chronic pain of both knees 03/29/2016    Past Medical History:  Diagnosis Date  . Overactive bladder   . Rheumatoid arthritis (HCC)     No family history on file. Past Surgical History:  Procedure Laterality Date  . ABDOMINAL HYSTERECTOMY    . CHOLECYSTECTOMY     Social History   Social History Narrative  . Not on file     Objective: Vital Signs: There were no vitals taken for this visit.   Physical Exam   Musculoskeletal Exam: ***  CDAI Exam: No CDAI exam completed.    Investigation: No additional findings. CBC Latest Ref Rng & Units 09/01/2016 06/14/2016 04/03/2016  WBC 3.8 - 10.8 K/uL 6.6 7.4 5.1  Hemoglobin 11.7 - 15.5 g/dL 11.912.8 14.712.1 82.912.8  Hematocrit 35.0 - 45.0 % 40.0 36.4 38.9  Platelets 140 - 400  K/uL 301 301 248   CMP Latest Ref Rng & Units 09/01/2016 06/14/2016 04/03/2016  Glucose 65 - 99 mg/dL 90 95 82  BUN 7 - 25 mg/dL 10 14 14   Creatinine 0.50 - 1.05 mg/dL 5.620.87 1.300.84 8.650.73  Sodium 135 - 146 mmol/L 141 142 141  Potassium 3.5 - 5.3 mmol/L 5.3 4.4 4.5  Chloride 98 - 110 mmol/L 106 108 107  CO2 20 - 31 mmol/L 23 23 24   Calcium 8.6 - 10.4 mg/dL 9.4 8.9 9.3  Total Protein 6.1 - 8.1 g/dL 6.4 6.3 6.2  Total Bilirubin 0.2 - 1.2 mg/dL 0.4 0.2 0.4  Alkaline Phos 33 - 130 U/L 96 82 85  AST 10 - 35 U/L 26 25 23   ALT 6 - 29 U/L 28 25 18     Imaging: No results found.  Speciality Comments: No specialty comments available.    Procedures:  No procedures performed Allergies: Patient has no known allergies.   Assessment / Plan:     Visit Diagnoses: No diagnosis found.    Orders: No orders of the defined types were placed in this encounter.  No orders of the defined types were placed in this encounter.   Face-to-face time spent with patient was *** minutes. 50% of time was spent in counseling and coordination of care.  Follow-Up Instructions: No Follow-up on file.   Ellen HenriMarissa C Fannie Gathright, CMA  Note - This record has been created using Animal nutritionistDragon software.  Chart creation errors have been sought, but may not always  have been located. Such creation errors do  not reflect on  the standard of medical care.

## 2017-02-18 ENCOUNTER — Other Ambulatory Visit: Payer: Self-pay | Admitting: Rheumatology

## 2017-02-20 NOTE — Telephone Encounter (Signed)
Need labs

## 2017-02-20 NOTE — Telephone Encounter (Signed)
Last Visit: 09/01/16 Next Visit: 1212/18 Labs: 09/01/16 WNL  Left message to advise patient she is due for labs.  Okay to refill MTX?

## 2017-02-23 ENCOUNTER — Other Ambulatory Visit: Payer: Self-pay | Admitting: *Deleted

## 2017-02-23 DIAGNOSIS — Z79899 Other long term (current) drug therapy: Secondary | ICD-10-CM

## 2017-02-23 LAB — CBC WITH DIFFERENTIAL/PLATELET
BASOS PCT: 1 %
Basophils Absolute: 62 cells/uL (ref 0–200)
EOS ABS: 130 {cells}/uL (ref 15–500)
Eosinophils Relative: 2.1 %
HCT: 38.7 % (ref 35.0–45.0)
HEMOGLOBIN: 13 g/dL (ref 11.7–15.5)
Lymphs Abs: 1872 cells/uL (ref 850–3900)
MCH: 30.3 pg (ref 27.0–33.0)
MCHC: 33.6 g/dL (ref 32.0–36.0)
MCV: 90.2 fL (ref 80.0–100.0)
MPV: 10.9 fL (ref 7.5–12.5)
Monocytes Relative: 12.4 %
NEUTROS ABS: 3367 {cells}/uL (ref 1500–7800)
Neutrophils Relative %: 54.3 %
Platelets: 283 10*3/uL (ref 140–400)
RBC: 4.29 10*6/uL (ref 3.80–5.10)
RDW: 12.7 % (ref 11.0–15.0)
Total Lymphocyte: 30.2 %
WBC: 6.2 10*3/uL (ref 3.8–10.8)
WBCMIX: 769 {cells}/uL (ref 200–950)

## 2017-02-23 LAB — COMPLETE METABOLIC PANEL WITH GFR
AG Ratio: 1.5 (calc) (ref 1.0–2.5)
ALT: 22 U/L (ref 6–29)
AST: 23 U/L (ref 10–35)
Albumin: 4 g/dL (ref 3.6–5.1)
Alkaline phosphatase (APISO): 98 U/L (ref 33–130)
BUN: 12 mg/dL (ref 7–25)
CALCIUM: 9.6 mg/dL (ref 8.6–10.4)
CO2: 28 mmol/L (ref 20–32)
CREATININE: 0.74 mg/dL (ref 0.50–1.05)
Chloride: 108 mmol/L (ref 98–110)
GFR, EST NON AFRICAN AMERICAN: 91 mL/min/{1.73_m2} (ref >=60)
GFR, Est African American: 105 mL/min/{1.73_m2} (ref >=60)
Globulin: 2.6 g/dL (calc) (ref 1.9–3.7)
Glucose, Bld: 85 mg/dL (ref 65–99)
Potassium: 5.2 mmol/L (ref 3.5–5.3)
Sodium: 142 mmol/L (ref 135–146)
Total Bilirubin: 0.4 mg/dL (ref 0.2–1.2)
Total Protein: 6.6 g/dL (ref 6.1–8.1)

## 2017-02-23 MED ORDER — METHOTREXATE 2.5 MG PO TABS: ORAL_TABLET | ORAL | 0 refills | Status: DC

## 2017-02-23 NOTE — Addendum Note (Signed)
Addended by: Henriette CombsHATTON, Ceci Taliaferro L on: 02/23/2017 09:33 AM   Modules accepted: Orders

## 2017-02-23 NOTE — Telephone Encounter (Signed)
Patient came in for labs on 02/23/17.   Okay to refill per Dr. Corliss Skainseveshwar

## 2017-02-27 NOTE — Progress Notes (Signed)
WNLs

## 2017-02-28 ENCOUNTER — Ambulatory Visit: Payer: BC Managed Care – PPO | Admitting: Rheumatology

## 2017-05-27 ENCOUNTER — Other Ambulatory Visit: Payer: Self-pay | Admitting: Rheumatology

## 2017-05-28 NOTE — Telephone Encounter (Addendum)
Last Visit: 10/16/16 Next Visit: 07/24/17 Labs: 02/23/17  Left message for patient that she is due to update labs.  Okay to refill 30 day supply per Dr. Corliss Skainseveshwar

## 2017-06-04 ENCOUNTER — Other Ambulatory Visit: Payer: Self-pay | Admitting: *Deleted

## 2017-06-04 DIAGNOSIS — Z79899 Other long term (current) drug therapy: Secondary | ICD-10-CM

## 2017-06-05 LAB — CBC WITH DIFFERENTIAL/PLATELET
Basophils Absolute: 61 cells/uL (ref 0–200)
Basophils Relative: 0.8 %
Eosinophils Absolute: 122 cells/uL (ref 15–500)
Eosinophils Relative: 1.6 %
HEMATOCRIT: 39.1 % (ref 35.0–45.0)
HEMOGLOBIN: 13.2 g/dL (ref 11.7–15.5)
LYMPHS ABS: 2227 {cells}/uL (ref 850–3900)
MCH: 30.2 pg (ref 27.0–33.0)
MCHC: 33.8 g/dL (ref 32.0–36.0)
MCV: 89.5 fL (ref 80.0–100.0)
MPV: 11 fL (ref 7.5–12.5)
Monocytes Relative: 11.7 %
NEUTROS ABS: 4302 {cells}/uL (ref 1500–7800)
Neutrophils Relative %: 56.6 %
Platelets: 317 10*3/uL (ref 140–400)
RBC: 4.37 10*6/uL (ref 3.80–5.10)
RDW: 12.6 % (ref 11.0–15.0)
Total Lymphocyte: 29.3 %
WBC: 7.6 10*3/uL (ref 3.8–10.8)
WBCMIX: 889 {cells}/uL (ref 200–950)

## 2017-06-05 LAB — COMPLETE METABOLIC PANEL WITH GFR
AG Ratio: 1.7 (calc) (ref 1.0–2.5)
ALT: 36 U/L — AB (ref 6–29)
AST: 35 U/L (ref 10–35)
Albumin: 4.3 g/dL (ref 3.6–5.1)
Alkaline phosphatase (APISO): 111 U/L (ref 33–130)
BILIRUBIN TOTAL: 0.5 mg/dL (ref 0.2–1.2)
BUN: 12 mg/dL (ref 7–25)
CALCIUM: 9.7 mg/dL (ref 8.6–10.4)
CO2: 32 mmol/L (ref 20–32)
CREATININE: 0.98 mg/dL (ref 0.50–1.05)
Chloride: 109 mmol/L (ref 98–110)
GFR, EST AFRICAN AMERICAN: 75 mL/min/{1.73_m2} (ref >=60)
GFR, EST NON AFRICAN AMERICAN: 64 mL/min/{1.73_m2} (ref >=60)
GLUCOSE: 79 mg/dL (ref 65–99)
Globulin: 2.5 g/dL (calc) (ref 1.9–3.7)
Potassium: 5.5 mmol/L — ABNORMAL HIGH (ref 3.5–5.3)
Sodium: 146 mmol/L (ref 135–146)
TOTAL PROTEIN: 6.8 g/dL (ref 6.1–8.1)

## 2017-06-05 NOTE — Progress Notes (Signed)
K is high. Probably the blood is hemolyzed. Please notify Pt and fax labs to her PCP.

## 2017-06-07 ENCOUNTER — Telehealth: Payer: Self-pay | Admitting: Rheumatology

## 2017-06-07 NOTE — Telephone Encounter (Signed)
Spoke with patient yesterday afternoon and explained her lab results to her. Patient verbalized understanding.

## 2017-06-07 NOTE — Telephone Encounter (Signed)
Patient left a voicemail requesting the results of her bloodwork.   °

## 2017-06-25 ENCOUNTER — Other Ambulatory Visit: Payer: Self-pay | Admitting: Rheumatology

## 2017-06-25 NOTE — Telephone Encounter (Signed)
Last visit: 10/16/2016 Next visit: 07/24/2017 Labs: 06/04/2017 K is high. Probably the blood is hemolyzed  Okay to refill per Dr. Corliss Skainseveshwar.

## 2017-07-20 NOTE — Progress Notes (Signed)
Office Visit Note  Patient: Cathy Casey             Date of Birth: 11-Jan-1961           MRN: [id number]             PCP: Abner Greenspan, MD Referring: Abner Greenspan, MD Visit Date: 07/24/2017 Occupation: @    Subjective:  Pain in bilateral knee joints   History of Present Illness: Cathy Casey is a 57 y.o. female with history of seropositive rheumatoid arthritis and osteoarthritis.  Patient takes methotrexate 3 tablets once weekly and folic acid 1 mg daily.  She states that she has been having increased discomfort in her bilateral knees and bilateral ankles.  She states that she has swelling in her bilateral ankles.  She states she is also having discomfort in her right thumb.  She denies any recent injuries or overuse activities.  She denies any mechanical symptoms in her knee joints.  She states that occasionally when she will have some stiffness in her knee joints.  She has been using Voltaren gel and hemp cream which provide temporary relief.  She denies any other joint pain or joint swelling at this time.  She does feel her fatigue is worsening.   Activities of Daily Living:  Patient reports morning stiffness for 1 hour.   Patient Reports nocturnal pain.  Difficulty dressing/grooming: Denies Difficulty climbing stairs: Reports Difficulty getting out of chair: Reports Difficulty using hands for taps, buttons, cutlery, and/or writing: Denies   Review of Systems  Constitutional: Positive for fatigue.  HENT: Negative for mouth sores, mouth dryness and nose dryness.   Eyes: Negative for pain, visual disturbance and dryness.  Respiratory: Negative for cough, hemoptysis, shortness of breath and difficulty breathing.   Cardiovascular: Negative for chest pain, palpitations, hypertension and swelling in legs/feet.  Gastrointestinal: Negative for blood in stool, constipation and diarrhea.  Endocrine: Negative for increased urination.  Genitourinary: Negative for painful urination.    Musculoskeletal: Positive for arthralgias, joint pain, joint swelling and morning stiffness. Negative for myalgias, muscle weakness, muscle tenderness and myalgias.  Skin: Negative for color change, pallor, rash, hair loss, nodules/bumps, skin tightness, ulcers and sensitivity to sunlight.  Allergic/Immunologic: Negative for susceptible to infections.  Neurological: Negative for dizziness, numbness, headaches and weakness.  Hematological: Negative for swollen glands.  Psychiatric/Behavioral: Negative for depressed mood and sleep disturbance. The patient is not nervous/anxious.     PMFS History:  Patient Active Problem List   Diagnosis Date Noted  . Rheumatoid arthritis with rheumatoid factor of multiple sites without organ or systems involvement (HCC) 04/03/2016  . High risk medications (not anticoagulants) long-term use 04/03/2016  . Primary osteoarthritis of both knees 03/29/2016  . Chronic pain of both knees 03/29/2016    Past Medical History:  Diagnosis Date  . Overactive bladder   . Rheumatoid arthritis (HCC)     Family History  Problem Relation Age of Onset  . Diabetes Mother   . Rheum arthritis Sister   . Diabetes Sister   . Healthy Son   . Healthy Son   . Healthy Son    Past Surgical History:  Procedure Laterality Date  . ABDOMINAL HYSTERECTOMY    . CHOLECYSTECTOMY     Social History   Social History Narrative  . Not on file     Objective: Vital Signs: BP 113/70 (BP Location: Right Arm, Patient Position: Sitting, Cuff Size: Normal)   Pulse 60   Resp 16   Ht 5'  5" (1.651 m)   Wt 187 lb (84.8 kg)   BMI 31.12 kg/m    Physical Exam  Constitutional: She is oriented to person, place, and time. She appears well-developed and well-nourished.  HENT:  Head: Normocephalic and atraumatic.  Eyes: Conjunctivae and EOM are normal.  Neck: Normal range of motion.  Cardiovascular: Normal rate, regular rhythm, normal heart sounds and intact distal pulses.   Pulmonary/Chest: Effort normal and breath sounds normal.  Abdominal: Soft. Bowel sounds are normal.  Lymphadenopathy:    She has no cervical adenopathy.  Neurological: She is alert and oriented to person, place, and time.  Skin: Skin is warm and dry. Capillary refill takes less than 2 seconds.  Psychiatric: She has a normal mood and affect. Her behavior is normal.  Nursing note and vitals reviewed.    Musculoskeletal Exam: C-spine, thoracic spine, lumbar spine good range of motion.  No midline spinal tenderness.  No SI joint tenderness.  Shoulder joints, elbow joints, wrist joints, MCPs, PIPs, DIPs good range of motion with no synovitis.  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  Hip joints, knee joints, ankle joints, MTPs, PIPs, DIPs good range of motion no synovitis. No warmth or effusion of knee joints. Bilateral knee crepitus.  No tenderness of trochanteric bursa.    CDAI Exam: CDAI Homunculus Exam:   Joint Counts:  CDAI Tender Joint count: 0 CDAI Swollen Joint count: 0  Global Assessments:  Patient Global Assessment: 7   CDAI Calculated Score: 7    Investigation: No additional findings. CBC Latest Ref Rng & Units 06/04/2017 02/23/2017 09/01/2016  WBC 3.8 - 10.8 Thousand/uL 7.6 6.2 6.6  Hemoglobin 11.7 - 15.5 g/dL 09.8 11.9 14.7  Hematocrit 35.0 - 45.0 % 39.1 38.7 40.0  Platelets 140 - 400 Thousand/uL 317 283 301   CMP Latest Ref Rng & Units 06/04/2017 02/23/2017 09/01/2016  Glucose 65 - 99 mg/dL 79 85 90  BUN 7 - 25 mg/dL Creatinine 0.50 - 1.05 mg/dL 8.29 5.62 1.30  Sodium 135 - 146 mmol/L 146 142 141  Potassium 3.5 - 5.3 mmol/L 5.5(H) 5.2 5.3  Chloride 98 - 110 mmol/L 109 108 106  CO2 20 - 32 mmol/L 32 28 23  Calcium 8.6 - 10.4 mg/dL 9.7 9.6 9.4  Total Protein 6.1 - 8.1 g/dL 6.8 6.6 6.4  Total Bilirubin 0.2 - 1.2 mg/dL 0.5 0.4 0.4  Alkaline Phos 33 - 130 U/L - - 96  AST 10 - 35 U/L 35 23 26  ALT 6 - 29 U/L  36(H) 22 28    Imaging: Xr Knee 3 View Left  Result Date: 07/24/2017 Moderate medial compartment joint space narrowing. Moderate patellofemoral joint space narrowing. Intercondylar osteophytes noted. Impression: Moderate osteoarthritis and moderate chondromalacia patella.    Xr Knee 3 View Right  Result Date: 07/24/2017 Moderate medial compartment joint space narrowing. Severe patellofemoral joint space narrowing. Intercondylar osteophytes noted. Impression: Moderate osteoarthritis and severe chondromalacia patella.     Speciality Comments: No specialty comments available.    Procedures:  Large Joint Inj: bilateral knee on 07/24/2017 12:13 PM Indications: pain Details: 27 G 1.5 in needle, medial approach  Arthrogram: No  Medications (Right): 1.5 mL lidocaine 1 %; 40 mg triamcinolone acetonide 40 MG/ML Aspirate (Right): 0 mL Medications (Left): 1.5 mL lidocaine 1 %; 40 mg triamcinolone acetonide 40 MG/ML Aspirate (Left): 0 mL Outcome: tolerated well, no immediate complications Procedure, treatment alternatives, risks and benefits explained,  specific risks discussed. Consent was given by the patient. Immediately prior to procedure a time out was called to verify the correct patient, procedure, equipment, support staff and site/side marked as required. Patient was prepped and draped in the usual sterile fashion.     Allergies: Patient has no known allergies.   Assessment / Plan:     Visit Diagnoses: Rheumatoid arthritis with rheumatoid factor of multiple sites without organ or systems involvement (HCC) - +RF, -CCP, -ANA: No synovitis on exam.  Her rheumatoid arthritis is very well controlled on methotrexate 3 tablets once weekly and folic acid 1 mg daily.  She has not had any recent rheumatoid arthritis flares.  She has bilateral knee pain which is most likely due to osteoarthritis.  She will continue on her current treatment regimen of methotrexate 3 tablets once weekly and folic acid 1  mg daily.  High risk medications (not anticoagulants) long-term use - MTX, folic acid.  CBC and CMP will be drawn in June and every 3 months to monitor for drug toxicity.  Primary osteoarthritis of both knees -she has bilateral knee pain.  X-rays were obtained today.  She is previously underwent 2 rounds of Hyalgan injections in bilateral knees.  Bilateral knee cortisone injections were performed today.  X-rays revealed moderate osteoarthritis in bilateral knees.  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  Plan: XR KNEE 3 VIEW LEFT, XR KNEE 3 VIEW RIGHT  Chronic pain of both knees -She is having increased discomfort in bilateral knees.  She has bilateral knee crepitus but good ROM. No warmth or effusion of knee joints.  She has no mechanical symptoms at this time.  X-rays of bilateral knees will be obtained today.  In the past she has underwent 2 rounds of Hyalgan knee injections.  She requested bilateral cortisone injections today. She was advised to monitor her blood pressure closely following the cortisone injection today.  We discussed potential side effects of cortisone injection.  She was given a handout of knee exercises that she can perform at home.  Plan: XR KNEE 3 VIEW LEFT, XR KNEE 3 VIEW RIGHT   Primary osteoarthritis of both feet: Mild PIP and DIP synovial thickening consistent with also arthritis in bilateral feet.  She has no discomfort in her feet at this time.  DDD (degenerative disc disease), lumbar: No midline spinal tenderness.  Good range of motion.  She has no discomfort in her lower back at this time.  Plantar fasciitis: resolved.   Overactive bladder     Orders: Orders Placed This Encounter  Procedures  . Large Joint Inj: bilateral knee  . XR KNEE 3 VIEW LEFT  . XR KNEE 3 VIEW RIGHT   No orders of the defined types were placed in this encounter.   Face-to-face time spent with patient was 30  minutes.  She had no synovitis on examination. >50% of time was spent in counseling and coordination of care.  Follow-Up Instructions: Return in about 5 months (around 12/24/2017) for Rheumatoid arthritis, Osteoarthritis.   Cathy Bienenstock, PA-C I examined and evaluated the patient with Cathy Ales PA. The plan of care was discussed as noted above.  Pollyann Savoy, MD Note - This record has been created using Animal nutritionist.  Chart creation errors have been sought, but may not always  have been located. Such creation errors do not reflect on  the standard of medical care.

## 2017-07-24 ENCOUNTER — Encounter: Payer: Self-pay | Admitting: Rheumatology

## 2017-07-24 ENCOUNTER — Ambulatory Visit (INDEPENDENT_AMBULATORY_CARE_PROVIDER_SITE_OTHER): Payer: Self-pay

## 2017-07-24 ENCOUNTER — Ambulatory Visit: Payer: BC Managed Care – PPO | Admitting: Rheumatology

## 2017-07-24 VITALS — BP 113/70 | HR 60 | Resp 16 | Ht 65.0 in | Wt 187.0 lb

## 2017-07-24 DIAGNOSIS — M0579 Rheumatoid arthritis with rheumatoid factor of multiple sites without organ or systems involvement: Secondary | ICD-10-CM

## 2017-07-24 DIAGNOSIS — M19072 Primary osteoarthritis, left ankle and foot: Secondary | ICD-10-CM | POA: Diagnosis not present

## 2017-07-24 DIAGNOSIS — M170 Bilateral primary osteoarthritis of knee: Secondary | ICD-10-CM

## 2017-07-24 DIAGNOSIS — M722 Plantar fascial fibromatosis: Secondary | ICD-10-CM | POA: Diagnosis not present

## 2017-07-24 DIAGNOSIS — Z79899 Other long term (current) drug therapy: Secondary | ICD-10-CM | POA: Diagnosis not present

## 2017-07-24 DIAGNOSIS — G8929 Other chronic pain: Secondary | ICD-10-CM | POA: Diagnosis not present

## 2017-07-24 DIAGNOSIS — M5136 Other intervertebral disc degeneration, lumbar region: Secondary | ICD-10-CM

## 2017-07-24 DIAGNOSIS — N3281 Overactive bladder: Secondary | ICD-10-CM | POA: Diagnosis not present

## 2017-07-24 DIAGNOSIS — M25561 Pain in right knee: Secondary | ICD-10-CM | POA: Diagnosis not present

## 2017-07-24 DIAGNOSIS — M25562 Pain in left knee: Secondary | ICD-10-CM

## 2017-07-24 DIAGNOSIS — M19071 Primary osteoarthritis, right ankle and foot: Secondary | ICD-10-CM

## 2017-07-24 MED ORDER — LIDOCAINE HCL 1 % IJ SOLN
1.5000 mL | INTRAMUSCULAR | Status: AC | PRN
  Administered 2017-07-24: 1.5 mL

## 2017-07-24 MED ORDER — TRIAMCINOLONE ACETONIDE 40 MG/ML IJ SUSP
40.0000 mg | INTRAMUSCULAR | Status: AC | PRN
  Administered 2017-07-24: 40 mg via INTRA_ARTICULAR

## 2017-07-24 NOTE — Patient Instructions (Addendum)
Knee Exercises Ask your health care provider which exercises are safe for you. Do exercises exactly as told by your health care provider and adjust them as directed. It is normal to feel mild stretching, pulling, tightness, or discomfort as you do these exercises, but you should stop right away if you feel sudden pain or your pain gets worse.Do not begin these exercises until told by your health care provider. STRETCHING AND RANGE OF MOTION EXERCISES These exercises warm up your muscles and joints and improve the movement and flexibility of your knee. These exercises also help to relieve pain, numbness, and tingling. Exercise A: Knee Extension, Prone 1. Lie on your abdomen on a bed. 2. Place your left / right knee just beyond the edge of the surface so your knee is not on the bed. You can put a towel under your left / right thigh just above your knee for comfort. 3. Relax your leg muscles and allow gravity to straighten your knee. You should feel a stretch behind your left / right knee. 4. Hold this position for __________ seconds. 5. Scoot up so your knee is supported between repetitions. Repeat __________ times. Complete this stretch __________ times a day. Exercise B: Knee Flexion, Active  1. Lie on your back with both knees straight. If this causes back discomfort, bend your left / right knee so your foot is flat on the floor. 2. Slowly slide your left / right heel back toward your buttocks until you feel a gentle stretch in the front of your knee or thigh. 3. Hold this position for __________ seconds. 4. Slowly slide your left / right heel back to the starting position. Repeat __________ times. Complete this exercise __________ times a day. Exercise C: Quadriceps, Prone  1. Lie on your abdomen on a firm surface, such as a bed or padded floor. 2. Bend your left / right knee and hold your ankle. If you cannot reach your ankle or pant leg, loop a belt around your foot and grab the belt  instead. 3. Gently pull your heel toward your buttocks. Your knee should not slide out to the side. You should feel a stretch in the front of your thigh and knee. 4. Hold this position for __________ seconds. Repeat __________ times. Complete this stretch __________ times a day. Exercise D: Hamstring, Supine 1. Lie on your back. 2. Loop a belt or towel over the ball of your left / right foot. The ball of your foot is on the walking surface, right under your toes. 3. Straighten your left / right knee and slowly pull on the belt to raise your leg until you feel a gentle stretch behind your knee. ? Do not let your left / right knee bend while you do this. ? Keep your other leg flat on the floor. 4. Hold this position for __________ seconds. Repeat __________ times. Complete this stretch __________ times a day. STRENGTHENING EXERCISES These exercises build strength and endurance in your knee. Endurance is the ability to use your muscles for a long time, even after they get tired. Exercise E: Quadriceps, Isometric  1. Lie on your back with your left / right leg extended and your other knee bent. Put a rolled towel or small pillow under your knee if told by your health care provider. 2. Slowly tense the muscles in the front of your left / right thigh. You should see your kneecap slide up toward your hip or see increased dimpling just above the knee. This   motion will push the back of the knee toward the floor. 3. For __________ seconds, keep the muscle as tight as you can without increasing your pain. 4. Relax the muscles slowly and completely. Repeat __________ times. Complete this exercise __________ times a day. Exercise F: Straight Leg Raises - Quadriceps 1. Lie on your back with your left / right leg extended and your other knee bent. 2. Tense the muscles in the front of your left / right thigh. You should see your kneecap slide up or see increased dimpling just above the knee. Your thigh may  even shake a bit. 3. Keep these muscles tight as you raise your leg 4-6 inches (10-15 cm) off the floor. Do not let your knee bend. 4. Hold this position for __________ seconds. 5. Keep these muscles tense as you lower your leg. 6. Relax your muscles slowly and completely after each repetition. Repeat __________ times. Complete this exercise __________ times a day. Exercise G: Hamstring, Isometric 1. Lie on your back on a firm surface. 2. Bend your left / right knee approximately __________ degrees. 3. Dig your left / right heel into the surface as if you are trying to pull it toward your buttocks. Tighten the muscles in the back of your thighs to dig as hard as you can without increasing any pain. 4. Hold this position for __________ seconds. 5. Release the tension gradually and allow your muscles to relax completely for __________ seconds after each repetition. Repeat __________ times. Complete this exercise __________ times a day. Exercise H: Hamstring Curls  If told by your health care provider, do this exercise while wearing ankle weights. Begin with __________ weights. Then increase the weight by 1 lb (0.5 kg) increments. Do not wear ankle weights that are more than __________. 1. Lie on your abdomen with your legs straight. 2. Bend your left / right knee as far as you can without feeling pain. Keep your hips flat against the floor. 3. Hold this position for __________ seconds. 4. Slowly lower your leg to the starting position.  Repeat __________ times. Complete this exercise __________ times a day. Exercise I: Squats (Quadriceps) 1. Stand in front of a table, with your feet and knees pointing straight ahead. You may rest your hands on the table for balance but not for support. 2. Slowly bend your knees and lower your hips like you are going to sit in a chair. ? Keep your weight over your heels, not over your toes. ? Keep your lower legs upright so they are parallel with the table  legs. ? Do not let your hips go lower than your knees. ? Do not bend lower than told by your health care provider. ? If your knee pain increases, do not bend as low. 3. Hold the squat position for __________ seconds. 4. Slowly push with your legs to return to standing. Do not use your hands to pull yourself to standing. Repeat __________ times. Complete this exercise __________ times a day. Exercise J: Wall Slides (Quadriceps)  1. Lean your back against a smooth wall or door while you walk your feet out 18-24 inches (46-61 cm) from it. 2. Place your feet hip-width apart. 3. Slowly slide down the wall or door until your knees bend __________ degrees. Keep your knees over your heels, not over your toes. Keep your knees in line with your hips. 4. Hold for __________ seconds. Repeat __________ times. Complete this exercise __________ times a day. Exercise K: Straight Leg Raises -   Hip Abductors 1. Lie on your side with your left / right leg in the top position. Lie so your head, shoulder, knee, and hip line up. You may bend your bottom knee to help you keep your balance. 2. Roll your hips slightly forward so your hips are stacked directly over each other and your left / right knee is facing forward. 3. Leading with your heel, lift your top leg 4-6 inches (10-15 cm). You should feel the muscles in your outer hip lifting. ? Do not let your foot drift forward. ? Do not let your knee roll toward the ceiling. 4. Hold this position for __________ seconds. 5. Slowly return your leg to the starting position. 6. Let your muscles relax completely after each repetition. Repeat __________ times. Complete this exercise __________ times a day. Exercise L: Straight Leg Raises - Hip Extensors 1. Lie on your abdomen on a firm surface. You can put a pillow under your hips if that is more comfortable. 2. Tense the muscles in your buttocks and lift your left / right leg about 4-6 inches (10-15 cm). Keep your knee  straight as you lift your leg. 3. Hold this position for __________ seconds. 4. Slowly lower your leg to the starting position. 5. Let your leg relax completely after each repetition. Repeat __________ times. Complete this exercise __________ times a day. This information is not intended to replace advice given to you by your health care provider. Make sure you discuss any questions you have with your health care provider. Document Released: 01/18/2005 Document Revised: 11/29/2015 Document Reviewed: 01/10/2015 Elsevier Interactive Patient Education  2018 [employer] We placed an order today for your standing lab work.    Please come back and get your standing labs in June and every 3 months  We have open lab Monday through Friday from 8:30-11:30 AM and 1:30-4:00 PM  at the office of Dr. Pollyann Savoy.   You may experience shorter wait times on Monday and Friday afternoons. The office is located at [address], [address], [place], [place] [address] No appointment is necessary.   Labs are drawn by [facility].  You may receive a bill from [facility] for your lab work. If you have any questions regarding directions or hours of operation,  please call [phone].

## 2017-08-31 ENCOUNTER — Other Ambulatory Visit: Payer: Self-pay

## 2017-08-31 DIAGNOSIS — Z79899 Other long term (current) drug therapy: Secondary | ICD-10-CM

## 2017-09-01 LAB — COMPLETE METABOLIC PANEL WITH GFR
AG Ratio: 1.7 (calc) (ref 1.0–2.5)
ALBUMIN MSPROF: 3.8 g/dL (ref 3.6–5.1)
ALKALINE PHOSPHATASE (APISO): 78 U/L (ref 33–130)
ALT: 21 U/L (ref 6–29)
AST: 22 U/L (ref 10–35)
BILIRUBIN TOTAL: 0.3 mg/dL (ref 0.2–1.2)
BUN: 12 mg/dL (ref 7–25)
CHLORIDE: 108 mmol/L (ref 98–110)
CO2: 29 mmol/L (ref 20–32)
Calcium: 9.1 mg/dL (ref 8.6–10.4)
Creat: 0.77 mg/dL (ref 0.50–1.05)
GFR, EST AFRICAN AMERICAN: 100 mL/min/{1.73_m2} (ref >=60)
GFR, Est Non African American: 86 mL/min/{1.73_m2} (ref >=60)
Globulin: 2.2 g/dL (calc) (ref 1.9–3.7)
Glucose, Bld: 82 mg/dL (ref 65–99)
POTASSIUM: 4.8 mmol/L (ref 3.5–5.3)
Sodium: 142 mmol/L (ref 135–146)
TOTAL PROTEIN: 6 g/dL — AB (ref 6.1–8.1)

## 2017-09-01 LAB — CBC WITH DIFFERENTIAL/PLATELET
BASOS ABS: 48 {cells}/uL (ref 0–200)
Basophils Relative: 0.7 %
EOS PCT: 1.5 %
Eosinophils Absolute: 102 cells/uL (ref 15–500)
HCT: 36.3 % (ref 35.0–45.0)
Hemoglobin: 12.4 g/dL (ref 11.7–15.5)
Lymphs Abs: 1564 cells/uL (ref 850–3900)
MCH: 30.8 pg (ref 27.0–33.0)
MCHC: 34.2 g/dL (ref 32.0–36.0)
MCV: 90.3 fL (ref 80.0–100.0)
MPV: 10.8 fL (ref 7.5–12.5)
Monocytes Relative: 10.9 %
NEUTROS PCT: 63.9 %
Neutro Abs: 4345 cells/uL (ref 1500–7800)
PLATELETS: 262 10*3/uL (ref 140–400)
RBC: 4.02 10*6/uL (ref 3.80–5.10)
RDW: 13.4 % (ref 11.0–15.0)
Total Lymphocyte: 23 %
WBC mixed population: 741 cells/uL (ref 200–950)
WBC: 6.8 10*3/uL (ref 3.8–10.8)

## 2017-09-03 NOTE — Progress Notes (Signed)
stable °

## 2017-09-13 ENCOUNTER — Other Ambulatory Visit: Payer: Self-pay | Admitting: Rheumatology

## 2017-09-13 NOTE — Telephone Encounter (Signed)
Last Visit: 07/24/17 Next Visit: 12/26/17 Labs: 08/31/17 stable  Okay to refill per Dr. Corliss Skainseveshwar

## 2017-09-18 ENCOUNTER — Telehealth: Payer: Self-pay | Admitting: Rheumatology

## 2017-09-18 MED ORDER — FOLIC ACID 1 MG PO TABS: 1.0000 mg | ORAL_TABLET | Freq: Every day | ORAL | 4 refills | Status: DC

## 2017-09-18 NOTE — Telephone Encounter (Signed)
Last Visit: 07/24/17 Next Visit: 12/26/17  Okay to refill per Dr. Corliss Skainseveshwar

## 2017-09-18 NOTE — Telephone Encounter (Signed)
Patient called requesting prescription refill of Folic Acid [facility] [address] in [place], [place].

## 2017-12-11 ENCOUNTER — Other Ambulatory Visit: Payer: Self-pay | Admitting: Rheumatology

## 2017-12-12 ENCOUNTER — Telehealth: Payer: Self-pay | Admitting: Rheumatology

## 2017-12-12 NOTE — Telephone Encounter (Addendum)
Last Visit: 07/24/17 Next Visit: 12/26/17 Labs: 08/31/17 stable  Advised patient's fiancee that she is due for labs and he will advise patient.   Okay to refill 30 day supply per Dr. Corliss Skains

## 2017-12-12 NOTE — Progress Notes (Signed)
Office Visit Note  Patient: Cathy Casey             Date of Birth: 09/12/1960           MRN: [id number]             PCP: Abner Greenspan, MD Referring: Abner Greenspan, MD Visit Date: 12/26/2017 Occupation: @GUAROCC @  Subjective:  Pain in both knees   History of Present Illness: Cathy Casey is a 57 y.o. female with history of seropositive rheumatoid arthritis, osteoarthritis, and DDD patient is on methotrexate 3 tablets by mouth once weekly and folic acid 1 mg daily.  She needs a refill of methotrexate today.  She denies any recent rheumatoid arthritis flares.  She reports that she continues to have bilateral plantar fasciitis.  She states that she tried stretching on a regular basis.  She has had injections in the past which were very effective.  She presents today with bilateral knee pain.  She is requesting bilateral cortisone injections.  She responds well to cortisone injections.  In the past she has had Euflexxa injections and would like to reapply through her insurance.  She has increased discomfort when going up and down steps.  She denies any other joint pain or joint swelling at this time.  She reports her lower back is doing well.  She states that she has noticed some increased hair loss over the past few months and is wondering if methotrexate could be contributing.    Activities of Daily Living:  Patient reports morning stiffness for 15 minutes.   Patient Reports nocturnal pain.  Difficulty dressing/grooming: Denies Difficulty climbing stairs: Reports Difficulty getting out of chair: Reports Difficulty using hands for taps, buttons, cutlery, and/or writing: Denies  Review of Systems  Constitutional: Positive for fatigue.  HENT: Negative for mouth sores, trouble swallowing, trouble swallowing, mouth dryness and nose dryness.   Eyes: Negative for pain, visual disturbance and dryness.  Respiratory: Negative for cough, hemoptysis, shortness of breath, wheezing and difficulty  breathing.   Cardiovascular: Negative for chest pain, palpitations, hypertension and swelling in legs/feet.  Gastrointestinal: Negative for abdominal pain, blood in stool, constipation, diarrhea, nausea and vomiting.  Endocrine: Negative for increased urination.  Genitourinary: Negative for painful urination, nocturia and pelvic pain.  Musculoskeletal: Positive for arthralgias, joint pain and morning stiffness. Negative for joint swelling, myalgias, muscle weakness, muscle tenderness and myalgias.  Skin: Positive for hair loss. Negative for color change, pallor, rash, nodules/bumps, skin tightness, ulcers and sensitivity to sunlight.  Allergic/Immunologic: Negative for susceptible to infections.  Neurological: Negative for dizziness, light-headedness, numbness, memory loss and weakness.  Hematological: Negative for bruising/bleeding tendency and swollen glands.  Psychiatric/Behavioral: Negative for depressed mood, confusion and sleep disturbance. The patient is not nervous/anxious.     PMFS History:  Patient Active Problem List   Diagnosis Date Noted  . Rheumatoid arthritis with rheumatoid factor of multiple sites without organ or systems involvement (HCC) 04/03/2016  . High risk medications (not anticoagulants) long-term use 04/03/2016  . Primary osteoarthritis of both knees 03/29/2016  . Chronic pain of both knees 03/29/2016    Past Medical History:  Diagnosis Date  . Overactive bladder   . Rheumatoid arthritis (HCC)     Family History  Problem Relation Age of Onset  . Diabetes Mother   . Rheum arthritis Sister   . Diabetes Sister   . Healthy Son   . Healthy Son   . Healthy Son    Past Surgical History:  Procedure Laterality  Date  . ABDOMINAL HYSTERECTOMY    . CHOLECYSTECTOMY     Social History   Social History Narrative  . Not on file    Objective: Vital Signs: BP 126/87 (BP Location: Left Arm, Patient Position: Sitting, Cuff Size: Normal)   Pulse 63   Resp 13    Ht 5\' 5"  (1.651 m)   Wt 176 lb 6.4 oz (80 kg)   BMI 29.35 kg/m    Physical Exam  Constitutional: She is oriented to person, place, and time. She appears well-developed and well-nourished.  HENT:  Head: Normocephalic and atraumatic.  Eyes: Conjunctivae and EOM are normal.  Neck: Normal range of motion.  Cardiovascular: Normal rate, regular rhythm, normal heart sounds and intact distal pulses.  Pulmonary/Chest: Effort normal and breath sounds normal.  Abdominal: Soft. Bowel sounds are normal.  Lymphadenopathy:    She has no cervical adenopathy.  Neurological: She is alert and oriented to person, place, and time.  Skin: Skin is warm and dry. Capillary refill takes less than 2 seconds.  Psychiatric: She has a normal mood and affect. Her behavior is normal.  Nursing note and vitals reviewed.    Musculoskeletal Exam: C-spine, thoracic spine, lumbar spine good range of motion.  No midline spinal tenderness.  No SI joint tenderness.  Shoulder joints, elbow joints, wrist joints, MCPs, PIPs, DIPs good range of motion with no synovitis.  She is complete fist formation bilaterally.  Hip joints good range of motion with no discomfort.  She is good range of motion bilateral knee joints with discomfort.  No warmth or effusion noted.  No tenderness or swelling of ankle joints.  No Achilles tendinitis.  She has been a fasciitis bilaterally.  CDAI Exam: CDAI Score: 0.2  Patient Global Assessment: 1 (mm); Provider Global Assessment: 1 (mm) Swollen: 0 ; Tender: 0  Joint Exam   Not documented   There is currently no information documented on the homunculus. Go to the Rheumatology activity and complete the homunculus joint exam.  Investigation: No additional findings.  Imaging: No results found.  Recent Labs: Lab Results  Component Value Date   WBC 6.8 08/31/2017   HGB 12.4 08/31/2017   PLT 262 08/31/2017   NA 142 08/31/2017   K 4.8 08/31/2017   CL 108 08/31/2017   CO2 29 08/31/2017    GLUCOSE 82 08/31/2017   BUN 12 08/31/2017   CREATININE 0.77 08/31/2017   BILITOT 0.3 08/31/2017   ALKPHOS 96 09/01/2016   AST 22 08/31/2017   ALT 21 08/31/2017   PROT 6.0 (L) 08/31/2017   ALBUMIN 4.1 09/01/2016   CALCIUM 9.1 08/31/2017   GFRAA 100 08/31/2017    Speciality Comments: No specialty comments available.  Procedures:  Large Joint Inj: bilateral knee on 12/26/2017 4:00 PM Indications: pain Details: 27 G 1.5 in needle, medial approach  Arthrogram: No  Medications (Right): 1.5 mL lidocaine 1 %; 40 mg triamcinolone acetonide 40 MG/ML Aspirate (Right): 0 mL Medications (Left): 1.5 mL lidocaine 1 %; 40 mg triamcinolone acetonide 40 MG/ML Aspirate (Left): 0 mL Outcome: tolerated well, no immediate complications Procedure, treatment alternatives, risks and benefits explained, specific risks discussed. Consent was given by the patient. Immediately prior to procedure a time out was called to verify the correct patient, procedure, equipment, support staff and site/side marked as required. Patient was prepped and draped in the usual sterile fashion.     Allergies: Patient has no known allergies.   Assessment / Plan:     Visit Diagnoses: Rheumatoid  arthritis with rheumatoid factor of multiple sites without organ or systems involvement (HCC) - +RF, -CCP, -ANA: She has no synovitis on exam.  She has not had any recent rheumatoid arthritis flares.  She is clinically doing well on methotrexate 3 tablets by mouth once weekly and folic acid 1 mg daily.  A refill of methotrexate was sent to the pharmacy today.  CBC and CMP will be ordered today.  She presents with bilateral knee pain but no warmth or effusion was noted.  She requested bilateral cortisone injections.  She tolerated procedure well.  She will continue on the current treatment regimen of methotrexate 3 tablets by mouth once weekly and folic acid 1 mg by mouth daily.  She is advised to notify us if she does increased joint pain or  joint swelling.  She will follow-up in the office in 5 months.  High risk medication use - methotrexate 3 tablets once weekly and folic acid 1 mg daily. -CBC and CMP will be drawn today to monitor for drug toxicity.  She will return in January every 3 months for lab work.  Plan: CBC with Differential/Platelet, COMPLETE METABOLIC PANEL WITH GFR  Primary osteoarthritis of both knees: She presents today with discomfort in bilateral knee joints.  She is good range of motion with discomfort.  No warmth or effusion noted.  She requested bilateral cortisone injections.  She tolerated procedure well.  The procedure note is completed above.  We discussed ongoing concerning for submerging in the bathtub for 2 to 3 days.  Potential side effects were discussed.  She was advised to monitor her blood glucose level and blood pressure closely.  She can also use Aspercreme topically.  She was given a handout of knee exercises that she can perform at home.   Primary osteoarthritis of both feet: She has no discomfort at this time.  She was proper fitting shoes.  DDD (degenerative disc disease), lumbar: No midline spinal tenderness.  Good range of motion.  She has no discomfort in her lower back at this time.  Plantar fasciitis: She has bilateral plantar fasciitis.  She performs stretching exercises on a regular basis.  She is on cortisone injections in the past which were effective.  Overactive bladder   Orders: Orders Placed This Encounter  Procedures  . Medium Joint Inj  . Large Joint Inj  . CBC with Differential/Platelet  . COMPLETE METABOLIC PANEL WITH GFR   Meds ordered this encounter  Medications  . methotrexate (RHEUMATREX) 2.5 MG tablet    Sig: TAKE 3 TABLETS BY MOUTH ONCE A WEEK AS DIRECTED    Dispense:  36 tablet    Refill:  0    Face-to-face time spent with patient was 30 minutes. Greater than 50% of time was spent in counseling and coordination of care.  Follow-Up Instructions: Return in  about 5 months (around 05/27/2018) for Rheumatoid arthritis, Osteoarthritis.   Gearldine Bienenstock, PA-C  Note - This record has been created using Dragon software.  Chart creation errors have been sought, but may not always  have been located. Such creation errors do not reflect on  the standard of medical care.

## 2017-12-12 NOTE — Telephone Encounter (Signed)
Patient called stating she called [facility] to pick up her Meth[facility]ate and was told it could not be refilled because she is due for her labwork.  Patient states she has an appointment on 12/26/17 and was hoping to get her labwork done at that time.  Patient requested a return call.

## 2017-12-13 NOTE — Telephone Encounter (Signed)
Left message to advise patient 30 day supply of MTX was sent to the pharmacy.

## 2017-12-26 ENCOUNTER — Telehealth: Payer: Self-pay

## 2017-12-26 ENCOUNTER — Encounter: Payer: Self-pay | Admitting: Physician Assistant

## 2017-12-26 ENCOUNTER — Ambulatory Visit: Payer: BC Managed Care – PPO | Admitting: Physician Assistant

## 2017-12-26 VITALS — BP 126/87 | HR 63 | Resp 13 | Ht 65.0 in | Wt 176.4 lb

## 2017-12-26 DIAGNOSIS — M25562 Pain in left knee: Secondary | ICD-10-CM

## 2017-12-26 DIAGNOSIS — M170 Bilateral primary osteoarthritis of knee: Secondary | ICD-10-CM | POA: Diagnosis not present

## 2017-12-26 DIAGNOSIS — M5136 Other intervertebral disc degeneration, lumbar region: Secondary | ICD-10-CM

## 2017-12-26 DIAGNOSIS — N3281 Overactive bladder: Secondary | ICD-10-CM

## 2017-12-26 DIAGNOSIS — Z79899 Other long term (current) drug therapy: Secondary | ICD-10-CM | POA: Diagnosis not present

## 2017-12-26 DIAGNOSIS — G8929 Other chronic pain: Secondary | ICD-10-CM

## 2017-12-26 DIAGNOSIS — M722 Plantar fascial fibromatosis: Secondary | ICD-10-CM

## 2017-12-26 DIAGNOSIS — M19071 Primary osteoarthritis, right ankle and foot: Secondary | ICD-10-CM | POA: Diagnosis not present

## 2017-12-26 DIAGNOSIS — M0579 Rheumatoid arthritis with rheumatoid factor of multiple sites without organ or systems involvement: Secondary | ICD-10-CM | POA: Diagnosis not present

## 2017-12-26 DIAGNOSIS — M19072 Primary osteoarthritis, left ankle and foot: Secondary | ICD-10-CM

## 2017-12-26 DIAGNOSIS — M25561 Pain in right knee: Secondary | ICD-10-CM | POA: Diagnosis not present

## 2017-12-26 LAB — COMPLETE METABOLIC PANEL WITH GFR
AG RATIO: 1.7 (calc) (ref 1.0–2.5)
ALBUMIN MSPROF: 4.3 g/dL (ref 3.6–5.1)
ALT: 17 U/L (ref 6–29)
AST: 20 U/L (ref 10–35)
Alkaline phosphatase (APISO): 88 U/L (ref 33–130)
BILIRUBIN TOTAL: 0.3 mg/dL (ref 0.2–1.2)
BUN: 11 mg/dL (ref 7–25)
CALCIUM: 9.8 mg/dL (ref 8.6–10.4)
CO2: 29 mmol/L (ref 20–32)
Chloride: 103 mmol/L (ref 98–110)
Creat: 0.78 mg/dL (ref 0.50–1.05)
GFR, EST AFRICAN AMERICAN: 98 mL/min/{1.73_m2} (ref >=60)
GFR, Est Non African American: 84 mL/min/{1.73_m2} (ref >=60)
GLOBULIN: 2.5 g/dL (ref 1.9–3.7)
Glucose, Bld: 77 mg/dL (ref 65–99)
POTASSIUM: 5.4 mmol/L — AB (ref 3.5–5.3)
SODIUM: 139 mmol/L (ref 135–146)
Total Protein: 6.8 g/dL (ref 6.1–8.1)

## 2017-12-26 LAB — CBC WITH DIFFERENTIAL/PLATELET
Basophils Absolute: 62 cells/uL (ref 0–200)
Basophils Relative: 0.7 %
EOS ABS: 116 {cells}/uL (ref 15–500)
Eosinophils Relative: 1.3 %
HEMATOCRIT: 39 % (ref 35.0–45.0)
Hemoglobin: 13.1 g/dL (ref 11.7–15.5)
LYMPHS ABS: 2821 {cells}/uL (ref 850–3900)
MCH: 30.2 pg (ref 27.0–33.0)
MCHC: 33.6 g/dL (ref 32.0–36.0)
MCV: 89.9 fL (ref 80.0–100.0)
MPV: 11.4 fL (ref 7.5–12.5)
Monocytes Relative: 10.8 %
Neutro Abs: 4940 cells/uL (ref 1500–7800)
Neutrophils Relative %: 55.5 %
PLATELETS: 321 10*3/uL (ref 140–400)
RBC: 4.34 10*6/uL (ref 3.80–5.10)
RDW: 12.1 % (ref 11.0–15.0)
TOTAL LYMPHOCYTE: 31.7 %
WBC: 8.9 10*3/uL (ref 3.8–10.8)
WBCMIX: 961 {cells}/uL — AB (ref 200–950)

## 2017-12-26 MED ORDER — TRIAMCINOLONE ACETONIDE 40 MG/ML IJ SUSP
40.0000 mg | INTRAMUSCULAR | Status: AC | PRN
  Administered 2017-12-26: 40 mg via INTRA_ARTICULAR

## 2017-12-26 MED ORDER — METHOTREXATE 2.5 MG PO TABS: ORAL_TABLET | ORAL | 0 refills | Status: DC

## 2017-12-26 MED ORDER — LIDOCAINE HCL 1 % IJ SOLN
1.5000 mL | INTRAMUSCULAR | Status: AC | PRN
  Administered 2017-12-26: 1.5 mL

## 2017-12-26 NOTE — Telephone Encounter (Signed)
Please apply for bilateral knee visco, per Ladona Ridgel. Patient has previously had euflexxa. Thanks!

## 2017-12-26 NOTE — Patient Instructions (Addendum)
Standing Labs We placed an order today for your standing lab work.    Please come back and get your standing labs in January and every 3 months   We have open lab Monday through Friday from 8:30-11:30 AM and 1:30-4:00 PM  at the office of Dr. Pollyann Savoy.   You may experience shorter wait times on Monday and Friday afternoons. The office is located at [address], [place], [place] [address] No appointment is necessary.   Labs are drawn by [facility].  You may receive a bill from [facility] for your lab work. If you have any questions regarding directions or hours of operation,  please call [phone].   Just as a reminder please drink plenty of water prior to coming for your lab work. Thanks!   Knee Exercises Ask your health care provider which exercises are safe for you. Do exercises exactly as told by your health care provider and adjust them as directed. It is normal to feel mild stretching, pulling, tightness, or discomfort as you do these exercises, but you should stop right away if you feel sudden pain or your pain gets worse.Do not begin these exercises until told by your health care provider. STRETCHING AND RANGE OF MOTION EXERCISES These exercises warm up your muscles and joints and improve the movement and flexibility of your knee. These exercises also help to relieve pain, numbness, and tingling. Exercise A: Knee Extension, Prone 1. Lie on your abdomen on a bed. 2. Place your left / right knee just beyond the edge of the surface so your knee is not on the bed. You can put a towel under your left / right thigh just above your knee for comfort. 3. Relax your leg muscles and allow gravity to straighten your knee. You should feel a stretch behind your left / right knee. 4. Hold this position for __________ seconds. 5. Scoot up so your knee is supported between repetitions. Repeat __________ times. Complete this stretch __________ times a day. Exercise B: Knee Flexion,  Active  1. Lie on your back with both knees straight. If this causes back discomfort, bend your left / right knee so your foot is flat on the floor. 2. Slowly slide your left / right heel back toward your buttocks until you feel a gentle stretch in the front of your knee or thigh. 3. Hold this position for __________ seconds. 4. Slowly slide your left / right heel back to the starting position. Repeat __________ times. Complete this exercise __________ times a day. Exercise C: Quadriceps, Prone  1. Lie on your abdomen on a firm surface, such as a bed or padded floor. 2. Bend your left / right knee and hold your ankle. If you cannot reach your ankle or pant leg, loop a belt around your foot and grab the belt instead. 3. Gently pull your heel toward your buttocks. Your knee should not slide out to the side. You should feel a stretch in the front of your thigh and knee. 4. Hold this position for __________ seconds. Repeat __________ times. Complete this stretch __________ times a day. Exercise D: Hamstring, Supine 1. Lie on your back. 2. Loop a belt or towel over the ball of your left / right foot. The ball of your foot is on the walking surface, right under your toes. 3. Straighten your left / right knee and slowly pull on the belt to raise your leg until you feel a gentle stretch behind your knee. ? Do not  let your left / right knee bend while you do this. ? Keep your other leg flat on the floor. 4. Hold this position for __________ seconds. Repeat __________ times. Complete this stretch __________ times a day. STRENGTHENING EXERCISES These exercises build strength and endurance in your knee. Endurance is the ability to use your muscles for a long time, even after they get tired. Exercise E: Quadriceps, Isometric  1. Lie on your back with your left / right leg extended and your other knee bent. Put a rolled towel or small pillow under your knee if told by your health care  provider. 2. Slowly tense the muscles in the front of your left / right thigh. You should see your kneecap slide up toward your hip or see increased dimpling just above the knee. This motion will push the back of the knee toward the floor. 3. For __________ seconds, keep the muscle as tight as you can without increasing your pain. 4. Relax the muscles slowly and completely. Repeat __________ times. Complete this exercise __________ times a day. Exercise F: Straight Leg Raises - Quadriceps 1. Lie on your back with your left / right leg extended and your other knee bent. 2. Tense the muscles in the front of your left / right thigh. You should see your kneecap slide up or see increased dimpling just above the knee. Your thigh may even shake a bit. 3. Keep these muscles tight as you raise your leg 4-6 inches (10-15 cm) off the floor. Do not let your knee bend. 4. Hold this position for __________ seconds. 5. Keep these muscles tense as you lower your leg. 6. Relax your muscles slowly and completely after each repetition. Repeat __________ times. Complete this exercise __________ times a day. Exercise G: Hamstring, Isometric 1. Lie on your back on a firm surface. 2. Bend your left / right knee approximately __________ degrees. 3. Dig your left / right heel into the surface as if you are trying to pull it toward your buttocks. Tighten the muscles in the back of your thighs to dig as hard as you can without increasing any pain. 4. Hold this position for __________ seconds. 5. Release the tension gradually and allow your muscles to relax completely for __________ seconds after each repetition. Repeat __________ times. Complete this exercise __________ times a day. Exercise H: Hamstring Curls  If told by your health care provider, do this exercise while wearing ankle weights. Begin with __________ weights. Then increase the weight by 1 lb (0.5 kg) increments. Do not wear ankle weights that are more than  __________. 1. Lie on your abdomen with your legs straight. 2. Bend your left / right knee as far as you can without feeling pain. Keep your hips flat against the floor. 3. Hold this position for __________ seconds. 4. Slowly lower your leg to the starting position.  Repeat __________ times. Complete this exercise __________ times a day. Exercise I: Squats (Quadriceps) 1. Stand in front of a table, with your feet and knees pointing straight ahead. You may rest your hands on the table for balance but not for support. 2. Slowly bend your knees and lower your hips like you are going to sit in a chair. ? Keep your weight over your heels, not over your toes. ? Keep your lower legs upright so they are parallel with the table legs. ? Do not let your hips go lower than your knees. ? Do not bend lower than told by your health care  provider. ? If your knee pain increases, do not bend as low. 3. Hold the squat position for __________ seconds. 4. Slowly push with your legs to return to standing. Do not use your hands to pull yourself to standing. Repeat __________ times. Complete this exercise __________ times a day. Exercise J: Wall Slides (Quadriceps)  1. Lean your back against a smooth wall or door while you walk your feet out 18-24 inches (46-61 cm) from it. 2. Place your feet hip-width apart. 3. Slowly slide down the wall or door until your knees bend __________ degrees. Keep your knees over your heels, not over your toes. Keep your knees in line with your hips. 4. Hold for __________ seconds. Repeat __________ times. Complete this exercise __________ times a day. Exercise K: Straight Leg Raises - Hip Abductors 1. Lie on your side with your left / right leg in the top position. Lie so your head, shoulder, knee, and hip line up. You may bend your bottom knee to help you keep your balance. 2. Roll your hips slightly forward so your hips are stacked directly over each other and your left / right  knee is facing forward. 3. Leading with your heel, lift your top leg 4-6 inches (10-15 cm). You should feel the muscles in your outer hip lifting. ? Do not let your foot drift forward. ? Do not let your knee roll toward the ceiling. 4. Hold this position for __________ seconds. 5. Slowly return your leg to the starting position. 6. Let your muscles relax completely after each repetition. Repeat __________ times. Complete this exercise __________ times a day. Exercise L: Straight Leg Raises - Hip Extensors 1. Lie on your abdomen on a firm surface. You can put a pillow under your hips if that is more comfortable. 2. Tense the muscles in your buttocks and lift your left / right leg about 4-6 inches (10-15 cm). Keep your knee straight as you lift your leg. 3. Hold this position for __________ seconds. 4. Slowly lower your leg to the starting position. 5. Let your leg relax completely after each repetition. Repeat __________ times. Complete this exercise __________ times a day. This information is not intended to replace advice given to you by your health care provider. Make sure you discuss any questions you have with your health care provider. Document Released: 01/18/2005 Document Revised: 11/29/2015 Document Reviewed: 01/10/2015 Elsevier Interactive Patient Education  2018 [employer].

## 2017-12-27 ENCOUNTER — Telehealth (INDEPENDENT_AMBULATORY_CARE_PROVIDER_SITE_OTHER): Payer: Self-pay

## 2017-12-27 NOTE — Progress Notes (Signed)
Potassium borderline elevated.  Please forward results to PCP.  CBC WNL

## 2017-12-27 NOTE — Telephone Encounter (Signed)
Submitted VOB for Euflexxa series, bilateral knee. °

## 2017-12-27 NOTE — Telephone Encounter (Signed)
Noted  

## 2017-12-28 ENCOUNTER — Telehealth: Payer: Self-pay | Admitting: Rheumatology

## 2017-12-28 NOTE — Telephone Encounter (Signed)
Patient called stating she was returning your call.   °

## 2017-12-28 NOTE — Telephone Encounter (Signed)
Potassium borderline elevated. CBC WNL. Copy sent to PCP.

## 2018-01-02 ENCOUNTER — Telehealth (INDEPENDENT_AMBULATORY_CARE_PROVIDER_SITE_OTHER): Payer: Self-pay

## 2018-01-02 NOTE — Telephone Encounter (Signed)
PA required for Euflexxa, bilateral knee.  Initiated PA with [employer] Per Denyse Amass.PA form being faxed today.

## 2018-01-29 ENCOUNTER — Telehealth (INDEPENDENT_AMBULATORY_CARE_PROVIDER_SITE_OTHER): Payer: Self-pay

## 2018-01-29 NOTE — Telephone Encounter (Signed)
PA required for Euflexxa, bilateral knee. Faxed completed PA form to [employer] at [phone].

## 2018-01-31 ENCOUNTER — Telehealth (INDEPENDENT_AMBULATORY_CARE_PROVIDER_SITE_OTHER): Payer: Self-pay

## 2018-01-31 NOTE — Telephone Encounter (Signed)
Submitted VOB for Synvisc series, bilateral knee. 

## 2018-01-31 NOTE — Telephone Encounter (Signed)
Received denial letter for Euflexxa, bilateral knee, from [employer] due to patient not trying/failing Synvisc/SynviscOne, Durolane, or Gelsyn-3.  Will submit for Synvisc series, bilateral knee.

## 2018-02-19 ENCOUNTER — Telehealth (INDEPENDENT_AMBULATORY_CARE_PROVIDER_SITE_OTHER): Payer: Self-pay

## 2018-02-19 NOTE — Telephone Encounter (Signed)
PA required for Synvisc series, bilateral knee. Faxed completed PA form to [employer] at [phone]. 

## 2018-02-27 ENCOUNTER — Telehealth (INDEPENDENT_AMBULATORY_CARE_PROVIDER_SITE_OTHER): Payer: Self-pay

## 2018-02-27 NOTE — Telephone Encounter (Signed)
Please schedule patient an appointment with Dr. Corliss Skainseveshwar or Ladona Ridgelaylor.  Thank you.  Patient is approved for Synvisc series, bilateral knee. Buy & Bill Covered at 100% after co-pay. Co-pay of $80.00 PA required PA Approval# [id number] Valid 02/19/2018- 02/19/2019

## 2018-03-16 IMAGING — DX DG CHEST 2V
2 series · 2 of 2 positions shown · non-contrast
Comparison: None.

CLINICAL DATA: Pre treatment checkup for immunosuppression therapy

EXAM:
CHEST  2 VIEW

[chest pa]
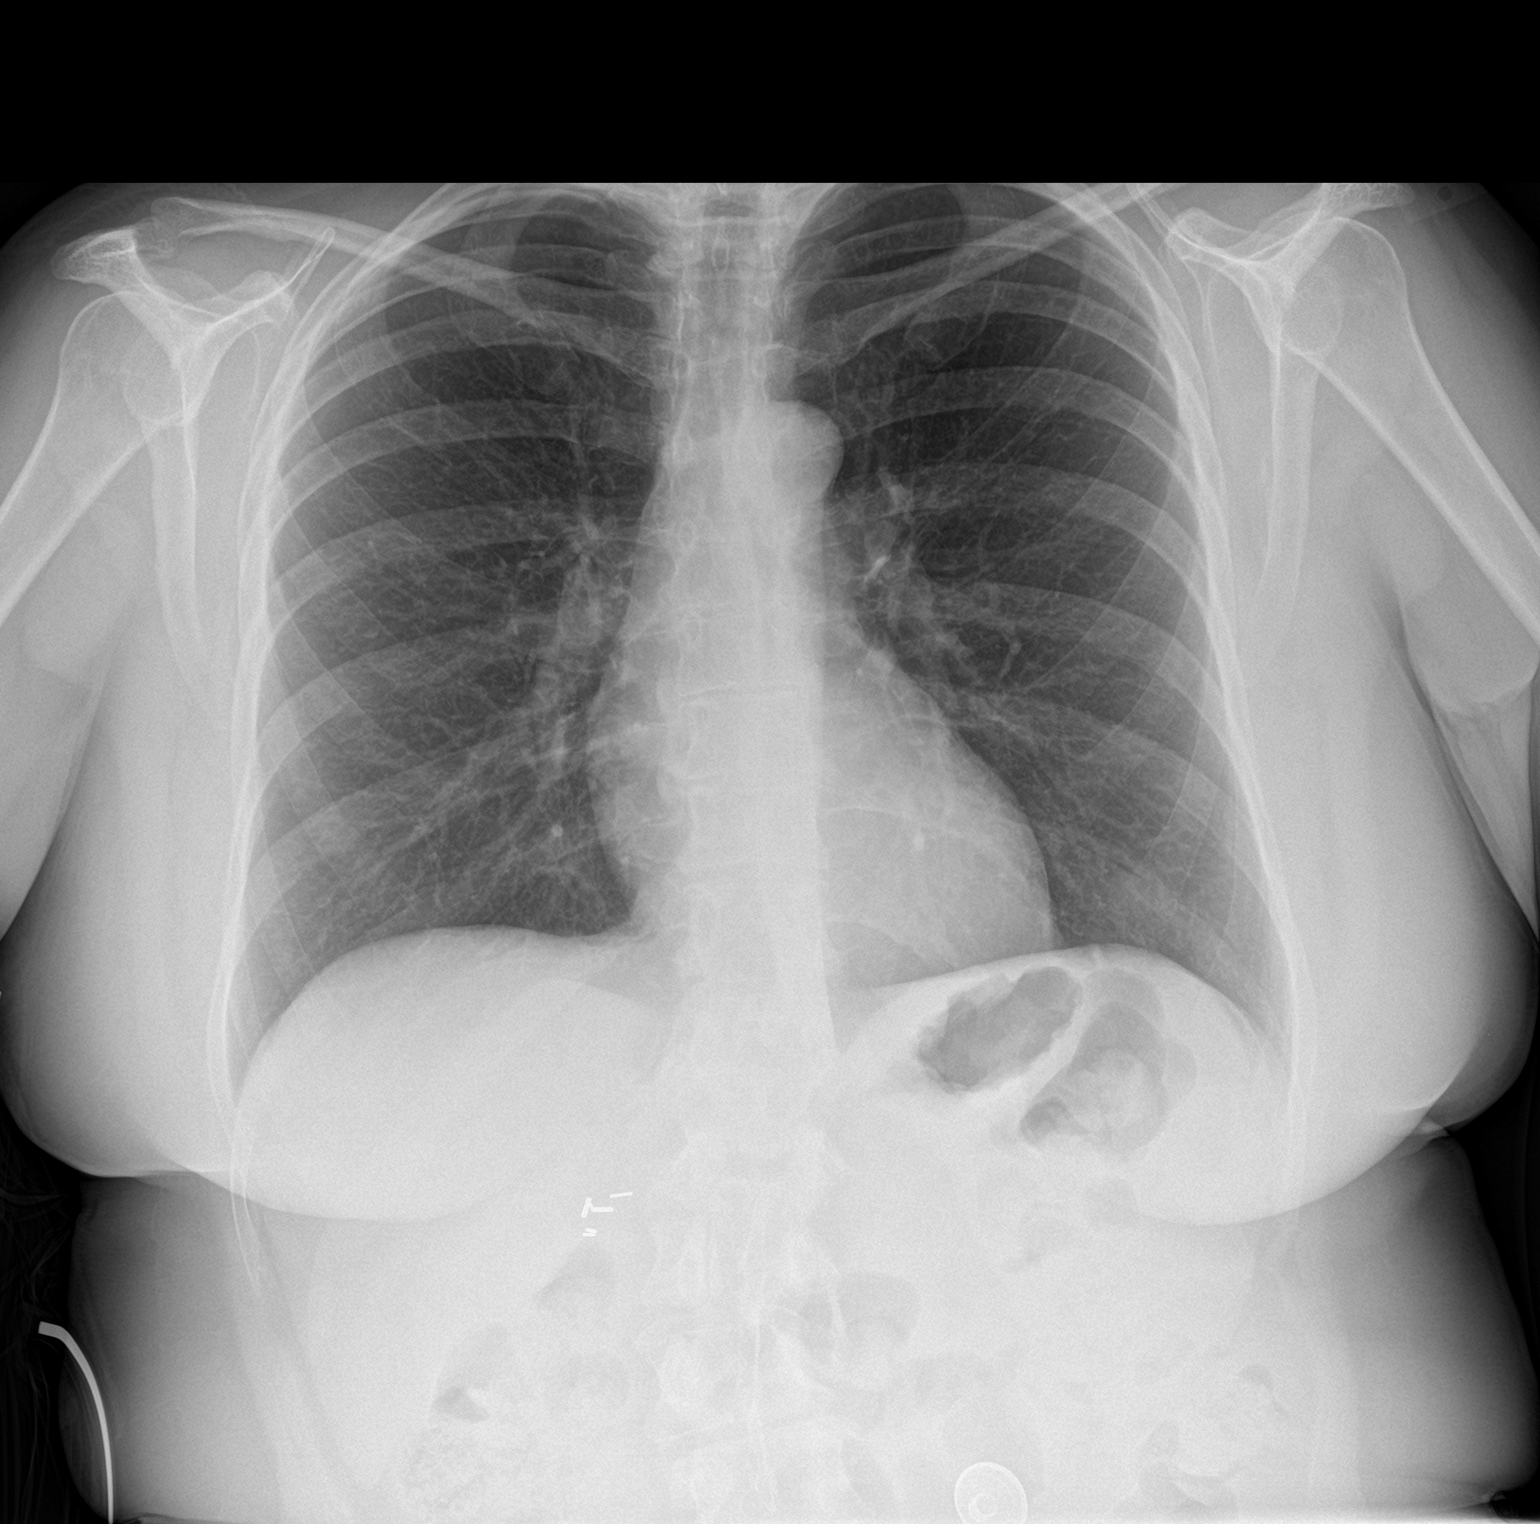

[chest lat]
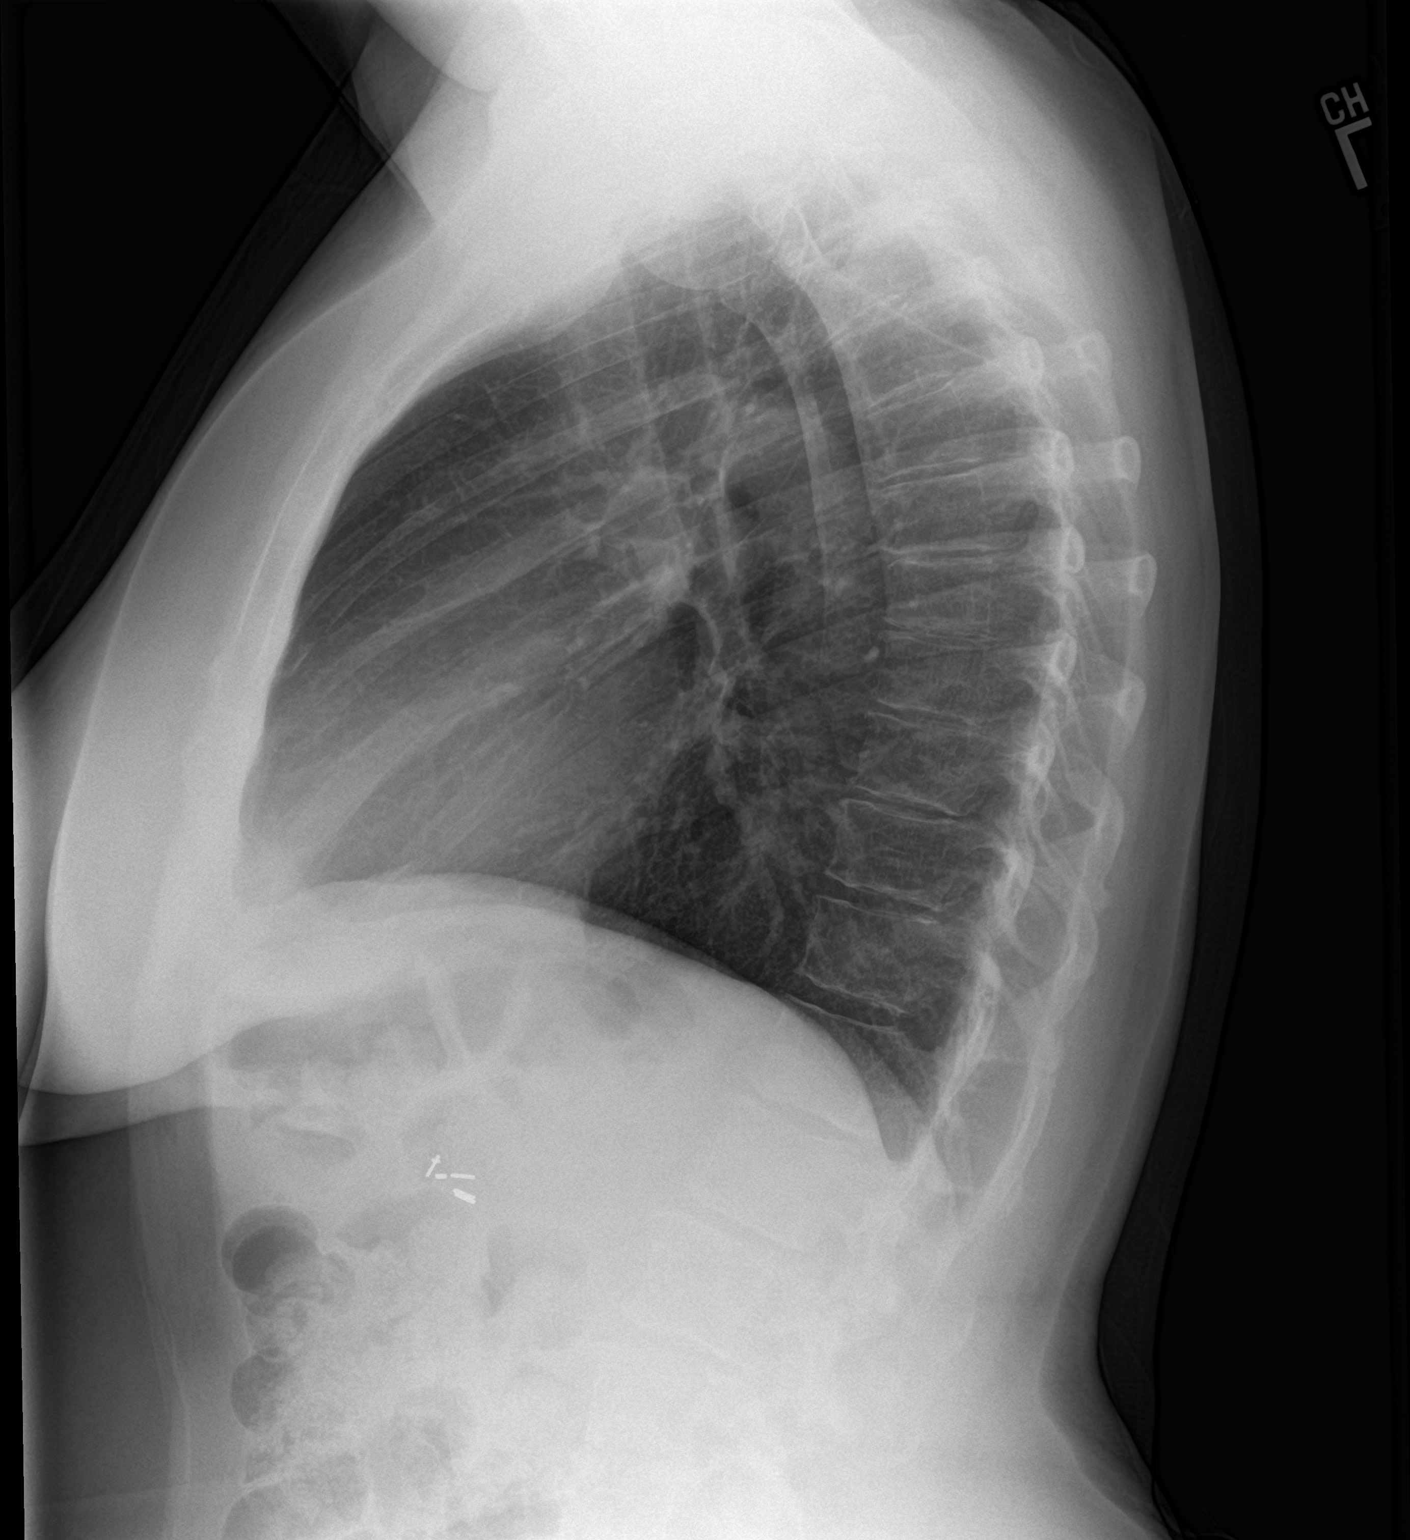

[2 of 2 positions shown; findings below may reference images not displayed]

FINDINGS: Cardiomediastinal silhouette is unremarkable. No infiltrate or
pleural effusion. No pulmonary edema. Bony thorax is unremarkable.
IMPRESSION: No active cardiopulmonary disease.

## 2018-04-02 ENCOUNTER — Other Ambulatory Visit: Payer: Self-pay | Admitting: Physician Assistant

## 2018-04-02 NOTE — Telephone Encounter (Signed)
Last Visit: 12/26/17 Next Visit: 05/29/18 Labs: 12/26/17 Potassium borderline elevated. CBC WNL  Left message for patient to call the office. Patient due for labs.

## 2018-04-02 NOTE — Telephone Encounter (Signed)
ok 

## 2018-04-08 ENCOUNTER — Other Ambulatory Visit: Payer: Self-pay

## 2018-04-08 DIAGNOSIS — Z79899 Other long term (current) drug therapy: Secondary | ICD-10-CM

## 2018-04-09 LAB — COMPLETE METABOLIC PANEL WITH GFR
AG Ratio: 1.7 (calc) (ref 1.0–2.5)
ALT: 22 U/L (ref 6–29)
AST: 28 U/L (ref 10–35)
Albumin: 3.8 g/dL (ref 3.6–5.1)
Alkaline phosphatase (APISO): 72 U/L (ref 33–130)
BUN: 10 mg/dL (ref 7–25)
CALCIUM: 9.3 mg/dL (ref 8.6–10.4)
CO2: 28 mmol/L (ref 20–32)
Chloride: 108 mmol/L (ref 98–110)
Creat: 0.82 mg/dL (ref 0.50–1.05)
GFR, EST NON AFRICAN AMERICAN: 79 mL/min/{1.73_m2} (ref >=60)
GFR, Est African American: 92 mL/min/{1.73_m2} (ref >=60)
Globulin: 2.3 g/dL (calc) (ref 1.9–3.7)
Glucose, Bld: 74 mg/dL (ref 65–99)
Potassium: 5.6 mmol/L — ABNORMAL HIGH (ref 3.5–5.3)
Sodium: 142 mmol/L (ref 135–146)
Total Bilirubin: 0.4 mg/dL (ref 0.2–1.2)
Total Protein: 6.1 g/dL (ref 6.1–8.1)

## 2018-04-09 LAB — CBC WITH DIFFERENTIAL/PLATELET
Absolute Monocytes: 608 cells/uL (ref 200–950)
BASOS ABS: 53 {cells}/uL (ref 0–200)
Basophils Relative: 0.9 %
EOS PCT: 1.7 %
Eosinophils Absolute: 100 cells/uL (ref 15–500)
HCT: 38.2 % (ref 35.0–45.0)
Hemoglobin: 12.9 g/dL (ref 11.7–15.5)
Lymphs Abs: 1894 cells/uL (ref 850–3900)
MCH: 30.9 pg (ref 27.0–33.0)
MCHC: 33.8 g/dL (ref 32.0–36.0)
MCV: 91.4 fL (ref 80.0–100.0)
MPV: 11.1 fL (ref 7.5–12.5)
Monocytes Relative: 10.3 %
NEUTROS ABS: 3245 {cells}/uL (ref 1500–7800)
Neutrophils Relative %: 55 %
Platelets: 314 10*3/uL (ref 140–400)
RBC: 4.18 10*6/uL (ref 3.80–5.10)
RDW: 13.4 % (ref 11.0–15.0)
Total Lymphocyte: 32.1 %
WBC: 5.9 10*3/uL (ref 3.8–10.8)

## 2018-04-09 NOTE — Progress Notes (Signed)
Potassium is high. Please notify patient and fax results to her PCP.

## 2018-05-06 ENCOUNTER — Other Ambulatory Visit: Payer: Self-pay | Admitting: Rheumatology

## 2018-05-06 NOTE — Telephone Encounter (Signed)
Last Visit: 12/26/17 Next Visit: 05/29/18 Labs: 04/08/18 Potassium is high.   Okay to refill per Dr. Corliss Skains

## 2018-05-13 ENCOUNTER — Ambulatory Visit: Payer: BC Managed Care – PPO | Admitting: Physician Assistant

## 2018-05-13 DIAGNOSIS — M170 Bilateral primary osteoarthritis of knee: Secondary | ICD-10-CM

## 2018-05-13 MED ORDER — LIDOCAINE HCL 1 % IJ SOLN
1.5000 mL | INTRAMUSCULAR | Status: AC | PRN
  Administered 2018-05-13: 1.5 mL

## 2018-05-13 MED ORDER — HYLAN G-F 20 16 MG/2ML IX SOSY
16.0000 mg | PREFILLED_SYRINGE | INTRA_ARTICULAR | Status: AC | PRN
  Administered 2018-05-13: 16 mg via INTRA_ARTICULAR

## 2018-05-13 NOTE — Progress Notes (Signed)
   Procedure Note  Patient: Cathy Casey             Date of Birth: [birth year]           MRN: [id number]             Visit Date: 05/13/2018  Procedures: Visit Diagnoses: Primary osteoarthritis of both knees - Plan: Large Joint Inj: bilateral knee Synvisc #1 Bilateral knee joint injections B/B Large Joint Inj: bilateral knee on 05/13/2018 8:11 AM Indications: pain Details: 25 G 1.5 in needle, medial approach  Arthrogram: No  Medications (Right): 16 mg Hylan 16 MG/2ML; 1.5 mL lidocaine 1 % Aspirate (Right): 0 mL Medications (Left): 16 mg Hylan 16 MG/2ML; 1.5 mL lidocaine 1 % Aspirate (Left): 0 mL Outcome: tolerated well, no immediate complications Procedure, treatment alternatives, risks and benefits explained, specific risks discussed. Consent was given by the patient. Immediately prior to procedure a time out was called to verify the correct patient, procedure, equipment, support staff and site/side marked as required. Patient was prepped and draped in the usual sterile fashion.    Patient tolerated the procedure well.  Sherron Ales, PA-C

## 2018-05-20 ENCOUNTER — Ambulatory Visit (INDEPENDENT_AMBULATORY_CARE_PROVIDER_SITE_OTHER): Payer: BC Managed Care – PPO | Admitting: Physician Assistant

## 2018-05-20 DIAGNOSIS — M170 Bilateral primary osteoarthritis of knee: Secondary | ICD-10-CM

## 2018-05-20 MED ORDER — LIDOCAINE HCL 1 % IJ SOLN
1.5000 mL | INTRAMUSCULAR | Status: AC | PRN
  Administered 2018-05-20: 1.5 mL

## 2018-05-20 MED ORDER — HYLAN G-F 20 16 MG/2ML IX SOSY
16.0000 mg | PREFILLED_SYRINGE | INTRA_ARTICULAR | Status: AC | PRN
  Administered 2018-05-20: 16 mg via INTRA_ARTICULAR

## 2018-05-20 NOTE — Progress Notes (Signed)
   Procedure Note  Patient: Cathy Casey             Date of Birth: [birth year]           MRN: [id number]             Visit Date: 05/20/2018  Procedures: Visit Diagnoses: Primary osteoarthritis of both knees - Plan: Large Joint Inj: bilateral knee Synvisc #2 bilateral B/B Large Joint Inj: bilateral knee on 05/20/2018 11:30 AM Indications: pain Details: 25 G 1.5 in needle, medial approach  Arthrogram: No  Medications (Right): 16 mg Hylan 16 MG/2ML; 1.5 mL lidocaine 1 % Aspirate (Right): 0 mL Medications (Left): 16 mg Hylan 16 MG/2ML; 1.5 mL lidocaine 1 % Aspirate (Left): 0 mL Outcome: tolerated well, no immediate complications Procedure, treatment alternatives, risks and benefits explained, specific risks discussed. Consent was given by the patient. Immediately prior to procedure a time out was called to verify the correct patient, procedure, equipment, support staff and site/side marked as required. Patient was prepped and draped in the usual sterile fashion.    Patient tolerated the procedure well.  Sherron Ales, PA-C

## 2018-05-27 ENCOUNTER — Encounter: Payer: Self-pay | Admitting: Physician Assistant

## 2018-05-27 ENCOUNTER — Ambulatory Visit (INDEPENDENT_AMBULATORY_CARE_PROVIDER_SITE_OTHER): Payer: BC Managed Care – PPO | Admitting: Physician Assistant

## 2018-05-27 VITALS — BP 129/78 | HR 62 | Resp 13 | Ht 65.0 in | Wt 161.2 lb

## 2018-05-27 DIAGNOSIS — M0579 Rheumatoid arthritis with rheumatoid factor of multiple sites without organ or systems involvement: Secondary | ICD-10-CM | POA: Diagnosis not present

## 2018-05-27 DIAGNOSIS — M170 Bilateral primary osteoarthritis of knee: Secondary | ICD-10-CM | POA: Diagnosis not present

## 2018-05-27 DIAGNOSIS — M51369 Other intervertebral disc degeneration, lumbar region without mention of lumbar back pain or lower extremity pain: Secondary | ICD-10-CM

## 2018-05-27 DIAGNOSIS — Z79899 Other long term (current) drug therapy: Secondary | ICD-10-CM | POA: Diagnosis not present

## 2018-05-27 DIAGNOSIS — M19071 Primary osteoarthritis, right ankle and foot: Secondary | ICD-10-CM

## 2018-05-27 DIAGNOSIS — M722 Plantar fascial fibromatosis: Secondary | ICD-10-CM

## 2018-05-27 DIAGNOSIS — N3281 Overactive bladder: Secondary | ICD-10-CM

## 2018-05-27 DIAGNOSIS — M19072 Primary osteoarthritis, left ankle and foot: Secondary | ICD-10-CM

## 2018-05-27 DIAGNOSIS — M5136 Other intervertebral disc degeneration, lumbar region: Secondary | ICD-10-CM

## 2018-05-27 MED ORDER — HYLAN G-F 20 16 MG/2ML IX SOSY
16.0000 mg | PREFILLED_SYRINGE | INTRA_ARTICULAR | Status: AC | PRN
  Administered 2018-05-27: 16 mg via INTRA_ARTICULAR

## 2018-05-27 MED ORDER — LIDOCAINE HCL 1 % IJ SOLN
1.5000 mL | INTRAMUSCULAR | Status: AC | PRN
  Administered 2018-05-27: 1.5 mL

## 2018-05-27 NOTE — Patient Instructions (Signed)
Standing Labs We placed an order today for your standing lab work.    Please come back and get your standing labs in April and every 3 months  We have open lab Monday through Friday from 8:30-11:30 AM and 1:30-4:00 PM  at the office of Dr. Shaili Deveshwar.   You may experience shorter wait times on Monday and Friday afternoons. The office is located at [address], [address], [place], [place] [address] No appointment is necessary.   Labs are drawn by [facility].  You may receive a bill from [facility] for your lab work.  If you wish to have your labs drawn at another location, please call the office 24 hours in advance to send orders.  If you have any questions regarding directions or hours of operation,  please call [phone].   Just as a reminder please drink plenty of water prior to coming for your lab work. Thanks!  

## 2018-05-27 NOTE — Progress Notes (Signed)
Office Visit Note  Patient: Cathy Casey             Date of Birth: 02/25/1961           MRN: [id number]             PCP: Abner Greenspan, MD Referring: Abner Greenspan, MD Visit Date: 05/27/2018 Occupation: @GUAROCC @  Subjective:  Pain in both knees   History of Present Illness: Cathy Casey is a 58 y.o. female with history of seropositive rheumatoid arthritis, osteoarthritis, and DDD.  She is taking MTX 3 tablets po once weekly and folic acid 1 mg po daily.  She denies any recent rheumatoid arthritis flares.  She has not missed any doses of methotrexate recently.  She has been tolerating methotrexate without any side effects.  She has not had any recent infections.  She reports that she occasionally will have pain in both feet but denies any joint swelling.  She has intermittent discomfort in bilateral knee joints.  She is having her third Synvisc injection today.  She denies any other joint pain or joint swelling at this time.  She denies any morning stiffness.  She denies any concerns.  She does not need any refills today.   Activities of Daily Living:  Patient reports morning stiffness for 0 minutes.   Patient Denies nocturnal pain.  Difficulty dressing/grooming: Denies Difficulty climbing stairs: Reports Difficulty getting out of chair: Denies Difficulty using hands for taps, buttons, cutlery, and/or writing: Denies  Review of Systems  Constitutional: Positive for fatigue.  HENT: Negative for mouth sores, mouth dryness and nose dryness.   Eyes: Negative for pain, visual disturbance and dryness.  Respiratory: Negative for cough, hemoptysis, shortness of breath and difficulty breathing.   Cardiovascular: Negative for chest pain, palpitations, hypertension and swelling in legs/feet.  Gastrointestinal: Negative for blood in stool, constipation and diarrhea.  Endocrine: Negative for increased urination.  Genitourinary: Negative for painful urination.  Musculoskeletal: Positive for  arthralgias and joint pain. Negative for joint swelling, myalgias, muscle weakness, morning stiffness, muscle tenderness and myalgias.  Skin: Negative for color change, pallor, rash, hair loss, nodules/bumps, skin tightness, ulcers and sensitivity to sunlight.  Allergic/Immunologic: Negative for susceptible to infections.  Neurological: Positive for headaches. Negative for dizziness, numbness and weakness.  Hematological: Negative for swollen glands.  Psychiatric/Behavioral: Negative for depressed mood and sleep disturbance. The patient is not nervous/anxious.     PMFS History:  Patient Active Problem List   Diagnosis Date Noted  . Rheumatoid arthritis with rheumatoid factor of multiple sites without organ or systems involvement (HCC) 04/03/2016  . High risk medications (not anticoagulants) long-term use 04/03/2016  . Primary osteoarthritis of both knees 03/29/2016  . Chronic pain of both knees 03/29/2016    Past Medical History:  Diagnosis Date  . Overactive bladder   . Rheumatoid arthritis (HCC)     Family History  Problem Relation Age of Onset  . Diabetes Mother   . Rheum arthritis Sister   . Diabetes Sister   . Healthy Son   . Healthy Son   . Healthy Son    Past Surgical History:  Procedure Laterality Date  . ABDOMINAL HYSTERECTOMY    . CHOLECYSTECTOMY     Social History   Social History Narrative  . Not on file    There is no immunization history on file for this patient.   Objective: Vital Signs: BP 129/78 (BP Location: Right Arm, Patient Position: Sitting, Cuff Size: Normal)   Pulse 62  Resp 13   Ht 5\' 5"  (1.651 m)   Wt 161 lb 3.2 oz (73.1 kg)   BMI 26.83 kg/m    Physical Exam Vitals signs and nursing note reviewed.  Constitutional:      Appearance: She is well-developed.  HENT:     Head: Normocephalic and atraumatic.  Eyes:     Conjunctiva/sclera: Conjunctivae normal.  Neck:     Musculoskeletal: Normal range of motion.  Cardiovascular:     Rate  and Rhythm: Normal rate and regular rhythm.     Heart sounds: Normal heart sounds.  Pulmonary:     Effort: Pulmonary effort is normal.     Breath sounds: Normal breath sounds.  Abdominal:     General: Bowel sounds are normal.     Palpations: Abdomen is soft.  Lymphadenopathy:     Cervical: No cervical adenopathy.  Skin:    General: Skin is warm and dry.     Capillary Refill: Capillary refill takes less than 2 seconds.  Neurological:     Mental Status: She is alert and oriented to person, place, and time.  Psychiatric:        Behavior: Behavior normal.      Musculoskeletal Exam: C-spine, thoracic spine, lumbar spine good range of motion.  No midline spinal tenderness.  No SI joint tenderness.  Shoulder joints, elbow joints, wrist joints, MCPs, PIPs, DIPs good range of motion no synovitis.  She has complete fist formation bilaterally.  Hip joints, knee joints, ankle joints, MTPs, PIPs, DIPs good range of motion no synovitis.  No warmth or effusion of bilateral knee joints.  No tenderness swelling of ankle joints.  No tenderness over trochanter bursa bilaterally.  CDAI Exam: CDAI Score: 0  Patient Global Assessment: 0 (mm); Provider Global Assessment: 0 (mm) Swollen: 0 ; Tender: 0  Joint Exam   Not documented   There is currently no information documented on the homunculus. Go to the Rheumatology activity and complete the homunculus joint exam.  Investigation: No additional findings.  Imaging: No results found.  Recent Labs: Lab Results  Component Value Date   WBC 5.9 04/08/2018   HGB 12.9 04/08/2018   PLT 314 04/08/2018   NA 142 04/08/2018   K 5.6 (H) 04/08/2018   CL 108 04/08/2018   CO2 28 04/08/2018   GLUCOSE 74 04/08/2018   BUN 10 04/08/2018   CREATININE 0.82 04/08/2018   BILITOT 0.4 04/08/2018   ALKPHOS 96 09/01/2016   AST 28 04/08/2018   ALT 22 04/08/2018   PROT 6.1 04/08/2018   ALBUMIN 4.1 09/01/2016   CALCIUM 9.3 04/08/2018   GFRAA 92 04/08/2018     Speciality Comments: No specialty comments available.  Procedures:  Large Joint Inj: bilateral knee on 05/27/2018 8:52 AM Indications: pain Details: 25 G 1.5 in needle, medial approach  Arthrogram: No  Medications (Right): 16 mg Hylan 16 MG/2ML; 1.5 mL lidocaine 1 % Aspirate (Right): 0 mL Medications (Left): 16 mg Hylan 16 MG/2ML; 1.5 mL lidocaine 1 % Aspirate (Left): 0 mL Outcome: tolerated well, no immediate complications Procedure, treatment alternatives, risks and benefits explained, specific risks discussed. Consent was given by the patient. Immediately prior to procedure a time out was called to verify the correct patient, procedure, equipment, support staff and site/side marked as required. Patient was prepped and draped in the usual sterile fashion.     Allergies: Patient has no known allergies.   Assessment / Plan:     Visit Diagnoses: Rheumatoid arthritis with rheumatoid factor  of multiple sites without organ or systems involvement [facility]): She has no active synovitis or tenderness on exam.  She has not had any recent rheumatoid arthritis flares.  She is clinically doing well on methotrexate 3 tablets by mouth once weekly and folic acid 1 mg by mouth daily.  She has not missed any doses recently.  She denied any recent infections.  She has occasional discomfort in bilateral feet and bilateral knee joints.  She will be receiving third Synvisc injection in bilateral knee joints today.  She has no warmth or effusion of bilateral knee joints.  She will continue on methotrexate 3 tablets by mouth once weekly and folic acid 1 mg by mouth daily.  She does not need any refills at this time.  She was advised to notify us if she develops increased joint pain or joint swelling.  She will follow-up in the office in 5 months.  High risk medication use - MTX 3 tablets po once weekly and folic acid 1 mg po daily.  CBC and CMP were drawn on 04/08/2018 and lab work revealed elevated potassium of 5.6  the rest of lab work was within normal months.  She will return for lab work in April and every 3 months to monitor for drug toxicity.  Standing orders are in place.  Primary osteoarthritis of both knees -no warmth or effusion.  She has good range of motion with no discomfort.  She presented today for her third Synvisc injection of the series.  She tolerated the procedure well.  The procedure notes are completed above.  Plan: Large Joint Inj: bilateral knee  Primary osteoarthritis of both feet: She has occasional discomfort in bilateral feet.  She has no tenderness or synovitis on exam.  She wears proper fitting shoes.  DDD (degenerative disc disease), lumbar: No midline spinal tenderness.  She has good range of motion with no discomfort at this time.  Plantar fasciitis: Resolved   Overactive bladder   Orders: Orders Placed This Encounter  Procedures  . Large Joint Inj: bilateral knee   No orders of the defined types were placed in this encounter.    Follow-Up Instructions: Return in about 5 months (around 10/27/2018) for Rheumatoid arthritis, Osteoarthritis, DDD.   Gearldine Bienenstock, PA-C  Note - This record has been created using Dragon software.  Chart creation errors have been sought, but may not always  have been located. Such creation errors do not reflect on  the standard of medical care.

## 2018-05-29 ENCOUNTER — Ambulatory Visit: Payer: BC Managed Care – PPO | Admitting: Physician Assistant

## 2018-07-29 ENCOUNTER — Telehealth: Payer: Self-pay | Admitting: Rheumatology

## 2018-07-29 MED ORDER — METHOTREXATE 2.5 MG PO TABS: ORAL_TABLET | ORAL | 0 refills | Status: DC

## 2018-07-29 NOTE — Telephone Encounter (Signed)
Patient called stating she had her labwork with her PCP Dr. Abner Greenspan.  Patient is not sure if all the bloodwork that Dr. Corliss Skains needs is part of her PCP's labs.  Patient states she will have their office fax a copy of her labwork results so she doesn't have the same labs twice.  Patient requested a return call once the results are received.

## 2018-07-29 NOTE — Telephone Encounter (Signed)
Attempted to contact the patient and left message for patient to call the office.  

## 2018-07-29 NOTE — Telephone Encounter (Signed)
Patient advised we have not received her lab work from her PCP and will advise her if she need additional lab work once we receive it. Patient request refill of MTX.    Last Visit: 12/26/17 Next Visit: 05/29/18 Labs: 04/08/18 Potassium is high.   Okay to refill per Dr. Corliss Skains

## 2018-07-29 NOTE — Addendum Note (Signed)
Addended by: Henriette Combs on: 07/29/2018 04:25 PM   Modules accepted: Orders

## 2018-08-05 ENCOUNTER — Telehealth: Payer: Self-pay | Admitting: Rheumatology

## 2018-08-05 NOTE — Telephone Encounter (Signed)
Patient left voicemail requesting a return call to let her know if the office received her bloodwork results.

## 2018-08-05 NOTE — Telephone Encounter (Signed)
Patient advised we have not received the lab work. Patient advised I attempted to contact the PCP's office to obtain them and have not been successful and will try again in the morning.

## 2018-08-25 ENCOUNTER — Other Ambulatory Visit: Payer: Self-pay | Admitting: Rheumatology

## 2018-08-26 NOTE — Telephone Encounter (Signed)
Please advise patient to update labs ASAP.  Ok to provide 30-day supply of MTX.

## 2018-08-26 NOTE — Telephone Encounter (Signed)
Last Visit: 12/26/17 Next Visit:10/23/18  Labs: 04/08/18 Elevated potassium   Patient's husband advised patient is due to update labs. Will advise patient.   Okay to refill 30 day supply MTX?

## 2018-09-09 ENCOUNTER — Telehealth: Payer: Self-pay | Admitting: Rheumatology

## 2018-09-09 ENCOUNTER — Other Ambulatory Visit: Payer: Self-pay | Admitting: *Deleted

## 2018-09-09 DIAGNOSIS — Z79899 Other long term (current) drug therapy: Secondary | ICD-10-CM

## 2018-09-09 NOTE — Telephone Encounter (Signed)
Lab Orders faxed

## 2018-09-09 NOTE — Telephone Encounter (Signed)
Patient called requesting her labwork orders be sent to her PCP Dr. Marco Collie.  Patient states she has an appointment today 09/09/18 at 1:30 pm.

## 2018-09-12 ENCOUNTER — Telehealth: Payer: Self-pay | Admitting: *Deleted

## 2018-09-12 NOTE — Telephone Encounter (Signed)
Called to advised patient we have received labs from PCPs office. Patient had labs drawn on 09/09/18. Chloride 107 and all other labs WNL.

## 2018-09-24 ENCOUNTER — Other Ambulatory Visit: Payer: Self-pay | Admitting: Rheumatology

## 2018-09-24 NOTE — Telephone Encounter (Signed)
Last Visit: 12/26/17 Next Visit: :10/23/18  Labs: 09/09/18. Chloride 107 and all other labs WNL.   Okay to refill per Dr. Estanislado Pandy

## 2018-10-09 NOTE — Progress Notes (Signed)
Office Visit Note  Patient: Cathy Casey             Date of Birth: [birth year]           MRN: [id number]             PCP: Marco Collie, MD Referring: Marco Collie, MD Visit Date: 10/23/2018 Occupation: @GUAROCC @  Subjective:  Intermittent discomfort in both hands   History of Present Illness: Cathy Casey is a 58 y.o. female with history of seropositive rheumatoid arthritis, osteoarthritis, and DDD.  Patient is on methotrexate 3 tablets by mouth once weekly and folic acid 1 mg by mouth daily. She denies missing any doses.  She denies any recent rheumatoid arthritis flares.  She has intermittent discomfort in both hands but no joint swelling.  She takes Mobic 15 mg po as needed for pain relief.  She has been experiencing symptoms of right sided sciatica for the past 2 months. She has occasional pain at night.  She had Synvisc injections in bilateral knee joints at the end of February and early March 2020.  She denies any knee joint pain or joint swelling at this time.  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    Activities of Daily Living:  Patient reports morning stiffness for 10 minutes.   Patient Reports nocturnal pain.  Difficulty dressing/grooming: Denies Difficulty climbing stairs: Reports Difficulty getting out of chair: Reports Difficulty using hands for taps, buttons, cutlery, and/or writing: Denies  Review of Systems  Constitutional: Positive for fatigue.  HENT: Negative for mouth sores, mouth dryness and nose dryness.   Eyes: Negative for pain, itching, visual disturbance and dryness.  Respiratory: Negative for cough, hemoptysis, shortness of breath, wheezing and difficulty breathing.   Cardiovascular: Negative for chest pain, palpitations, hypertension and swelling in legs/feet.  Gastrointestinal: Negative for abdominal pain, blood in stool, constipation and diarrhea.  Endocrine: Negative for increased urination.   Genitourinary: Negative for painful urination.  Musculoskeletal: Positive for morning stiffness. Negative for arthralgias, joint pain, joint swelling, myalgias, muscle weakness, muscle tenderness and myalgias.  Skin: Negative for color change, pallor, rash, hair loss, nodules/bumps, redness, skin tightness, ulcers and sensitivity to sunlight.  Allergic/Immunologic: Negative for susceptible to infections.  Neurological: Positive for headaches. Negative for dizziness, light-headedness, numbness, memory loss and weakness.  Hematological: Negative for bruising/bleeding tendency and swollen glands.  Psychiatric/Behavioral: Negative for depressed mood, confusion and sleep disturbance. The patient is not nervous/anxious.     PMFS History:  Patient Active Problem List   Diagnosis Date Noted  . Rheumatoid arthritis with rheumatoid factor of multiple sites without organ or systems involvement ([place]) 04/03/2016  . High risk medications (not anticoagulants) long-term use 04/03/2016  . Primary osteoarthritis of both knees 03/29/2016  . Chronic pain of both knees 03/29/2016    Past Medical History:  Diagnosis Date  . Overactive bladder   . Rheumatoid arthritis ([place])     Family History  Problem Relation Age of Onset  . Diabetes Mother   . Rheum arthritis Sister   . Diabetes Sister   . Healthy Son   . Healthy Son   . Healthy Son    Past Surgical History:  Procedure Laterality Date  . ABDOMINAL HYSTERECTOMY    . CHOLECYSTECTOMY     Social History   Social History Narrative  . Not on file    There is no immunization history on file for this patient.   Objective:  Vital Signs: BP 130/85 (BP Location: Right Arm, Patient Position: Sitting, Cuff Size: Normal)   Pulse 65   Resp 12   Ht 5\' 5"  (1.651 m)   Wt 152 lb (68.9 kg)   BMI 25.29 kg/m    Physical Exam Vitals signs and nursing note reviewed.  Constitutional:      Appearance: She is well-developed.  HENT:     Head: Normocephalic  and atraumatic.  Eyes:     Conjunctiva/sclera: Conjunctivae normal.  Neck:     Musculoskeletal: Normal range of motion.  Cardiovascular:     Rate and Rhythm: Normal rate and regular rhythm.     Heart sounds: Normal heart sounds.  Pulmonary:     Effort: Pulmonary effort is normal.     Breath sounds: Normal breath sounds.  Abdominal:     General: Bowel sounds are normal.     Palpations: Abdomen is soft.  Lymphadenopathy:     Cervical: No cervical adenopathy.  Skin:    General: Skin is warm and dry.     Capillary Refill: Capillary refill takes less than 2 seconds.  Neurological:     Mental Status: She is alert and oriented to person, place, and time.  Psychiatric:        Behavior: Behavior normal.      Musculoskeletal Exam: C-spine, thoracic spine, lumbar spine good range of motion.  No midline spinal tenderness.  No SI joint tenderness.  Shoulder joints, elbows, wrist joints, MCPs, PIPs, DIPs good range of motion no synovitis.  She has complete fist formation bilaterally.  Hip joints, knee joints, ankle joints, MTPs, PIPs, DIPs good range of motion with no synovitis.  No warmth or effusion of bilateral knee joints.  No tenderness or swelling of ankle joints.  No tenderness of MTP joints.  No tenderness over trochanter bursa bilaterally.  CDAI Exam: CDAI Score: 0.6  Patient Global: 3 mm; Provider Global: 3 mm Swollen: 0 ; Tender: 0  Joint Exam   No joint exam has been documented for this visit   There is currently no information documented on the homunculus. Go to the Rheumatology activity and complete the homunculus joint exam.  Investigation: No additional findings.  Imaging: No results found.  Recent Labs: Lab Results  Component Value Date   WBC 5.9 04/08/2018   HGB 12.9 04/08/2018   PLT 314 04/08/2018   NA 142 04/08/2018   K 5.6 (H) 04/08/2018   CL 108 04/08/2018   CO2 28 04/08/2018   GLUCOSE 74 04/08/2018   BUN 10 04/08/2018   CREATININE 0.82 04/08/2018    BILITOT 0.4 04/08/2018   ALKPHOS 96 09/01/2016   AST 28 04/08/2018   ALT 22 04/08/2018   PROT 6.1 04/08/2018   ALBUMIN 4.1 09/01/2016   CALCIUM 9.3 04/08/2018   GFRAA 92 04/08/2018    Speciality Comments: No specialty comments available.  Procedures:  No procedures performed Allergies: Patient has no known allergies.   Assessment / Plan:     Visit Diagnoses: Rheumatoid arthritis with rheumatoid factor of multiple sites without organ or systems involvement ([facility]) - She has no synovitis on exam.  She has not had any recent rheumatoid arthritis flares.  She is clinically doing well on methotrexate 3 tablets by mouth once weekly and folic acid 1 mg by mouth daily.  She has no joint tenderness on exam today.  She has intermittent discomfort in both hands but no inflammation.  She had Synvisc injections in bilateral knee joints in February/March 2020 which  resolved her knee joint pain.  She has no warmth or effusion of knee joints on exam.  She will continue on methotrexate 3 tablets by mouth once weekly and folic acid 1 mg by mouth daily.  She does not need any refills at this time.  She was advised to notify us if she develops increased joint pain or joint swelling.  She will follow-up in the office in 5 months.  High risk medication use - MTX 3 tablets po once weekly and folic acid 1 mg po daily.  CBC and CMP were within normal limits on 09/09/2018.  She will be due for lab work in September and every 3 months to monitor for drug toxicity.  Standing orders are in place.  She was advised to hold methotrexate if she develops any signs or symptoms of an infection and to resume once the infection is completely cleared.  We discussed the importance of social distancing and following a standard precautions recommended by the CDC.  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  Primary osteoarthritis of both knees  - She had bilateral Synvisc injections at the end of February and early March 2020.  She noted significant improvement after her Synvisc injections.  She has no knee joint pain or joint swelling at this time.  She has good range of motion with no discomfort on exam today.  She has some difficulty climbing steps or getting from a chair at times.  Primary osteoarthritis of both feet - She hs no tenderness or inflammation at this time.  She has no feet or ankle joint pain.  She wears proper fitting shoes.   DDD (degenerative disc disease), lumbar -She has good range of motion with no discomfort.  No midline spinal tenderness.  She is experiencing right-sided sciatica and has had intermittent symptoms for the past 2 months.  She has occasional nocturnal pain.  She has been taking Mobic 15 mg 1 tablet by mouth daily as needed for pain relief.  Plantar fasciitis - Resolved    Overactive bladder   Orders: No orders of the defined types were placed in this encounter.  No orders of the defined types were placed in this encounter.    Follow-Up Instructions: Return in about 5 months (around 03/25/2019) for Rheumatoid arthritis, Osteoarthritis, DDD.   Gearldine Bienenstockaylor M Bryam Taborda, PA-C  Note - This record has been created using Dragon software.  Chart creation errors have been sought, but may not always  have been located. Such creation errors do not reflect on  the standard of medical care.

## 2018-10-23 ENCOUNTER — Other Ambulatory Visit: Payer: Self-pay

## 2018-10-23 ENCOUNTER — Ambulatory Visit: Payer: BC Managed Care – PPO | Admitting: Physician Assistant

## 2018-10-23 ENCOUNTER — Encounter: Payer: Self-pay | Admitting: *Deleted

## 2018-10-23 ENCOUNTER — Encounter: Payer: Self-pay | Admitting: Physician Assistant

## 2018-10-23 VITALS — BP 130/85 | HR 65 | Resp 12 | Ht 65.0 in | Wt 152.0 lb

## 2018-10-23 DIAGNOSIS — M170 Bilateral primary osteoarthritis of knee: Secondary | ICD-10-CM

## 2018-10-23 DIAGNOSIS — M0579 Rheumatoid arthritis with rheumatoid factor of multiple sites without organ or systems involvement: Secondary | ICD-10-CM

## 2018-10-23 DIAGNOSIS — M19071 Primary osteoarthritis, right ankle and foot: Secondary | ICD-10-CM | POA: Diagnosis not present

## 2018-10-23 DIAGNOSIS — M19072 Primary osteoarthritis, left ankle and foot: Secondary | ICD-10-CM

## 2018-10-23 DIAGNOSIS — Z79899 Other long term (current) drug therapy: Secondary | ICD-10-CM | POA: Diagnosis not present

## 2018-10-23 DIAGNOSIS — M5136 Other intervertebral disc degeneration, lumbar region: Secondary | ICD-10-CM

## 2018-10-23 DIAGNOSIS — N3281 Overactive bladder: Secondary | ICD-10-CM

## 2018-10-23 DIAGNOSIS — M722 Plantar fascial fibromatosis: Secondary | ICD-10-CM

## 2018-10-23 NOTE — Patient Instructions (Signed)
Standing Labs We placed an order today for your standing lab work.    Please come back and get your standing labs in September and every 3 months  We have open lab daily Monday through Thursday from 8:30-12:30 PM and 1:30-4:30 PM and Friday from 8:30-12:30 PM and 1:30 -4:00 PM at the office of Dr. Shaili Deveshwar.   You may experience shorter wait times on Monday and Friday afternoons. The office is located at [address], [address], [place], [place] [address] No appointment is necessary.   Labs are drawn by [facility].  You may receive a bill from [facility] for your lab work.  If you wish to have your labs drawn at another location, please call the office 24 hours in advance to send orders.  If you have any questions regarding directions or hours of operation,  please call [phone].   Just as a reminder please drink plenty of water prior to coming for your lab work. Thanks!  

## 2018-12-03 ENCOUNTER — Other Ambulatory Visit: Payer: Self-pay | Admitting: *Deleted

## 2018-12-03 ENCOUNTER — Telehealth: Payer: Self-pay | Admitting: Rheumatology

## 2018-12-03 DIAGNOSIS — Z79899 Other long term (current) drug therapy: Secondary | ICD-10-CM

## 2018-12-03 NOTE — Telephone Encounter (Signed)
Lab Orders faxed

## 2018-12-03 NOTE — Telephone Encounter (Signed)
Patient left a voicemail requesting her labwork orders be sent to [facility] in [place].

## 2018-12-16 ENCOUNTER — Other Ambulatory Visit: Payer: Self-pay | Admitting: Rheumatology

## 2018-12-16 NOTE — Telephone Encounter (Signed)
Last Visit: 10/23/18 Next Visit: 03/26/19 Labs: 09/09/18 WNL  Okay to refill 30 day supply per Dr. Estanislado Pandy

## 2019-01-02 ENCOUNTER — Telehealth: Payer: Self-pay | Admitting: Rheumatology

## 2019-01-02 NOTE — Telephone Encounter (Signed)
Patient left a voicemail requesting a return call to let her know if you received her labwork results.

## 2019-01-02 NOTE — Telephone Encounter (Signed)
Attempted to contact the patient. No answer. Recording states "I'm sorry but the person you called has a voicemail that has not been set up.: Then phone hangs up. We have received the patient's labs drawn on 12/17/18 total protein 5.7 and Albumin 3.7 all other labs WNL

## 2019-01-08 ENCOUNTER — Telehealth: Payer: Self-pay | Admitting: Rheumatology

## 2019-01-08 MED ORDER — METHOTREXATE 2.5 MG PO TABS: ORAL_TABLET | ORAL | 0 refills | Status: DC

## 2019-01-08 NOTE — Telephone Encounter (Signed)
Patient advised we received labs from PCP. Patient request refill on MTX.    Last Visit: 10/23/18 Next Visit: 03/26/19 Labs: 12/17/18 total protein 5.7 and Albumin 3.7 all other labs WNL  Okay to refill per Dr. Estanislado Pandy

## 2019-01-08 NOTE — Telephone Encounter (Signed)
Patient calling to see if [name] office sent over her lab results. Please call to advise.

## 2019-01-14 ENCOUNTER — Other Ambulatory Visit: Payer: Self-pay | Admitting: Rheumatology

## 2019-03-24 ENCOUNTER — Telehealth: Payer: Self-pay | Admitting: Rheumatology

## 2019-03-24 DIAGNOSIS — Z79899 Other long term (current) drug therapy: Secondary | ICD-10-CM

## 2019-03-24 NOTE — Telephone Encounter (Signed)
Please send lab orders to Dr. Abner Greenspan [facility]. Patient has an appointment there this Thursday.

## 2019-03-24 NOTE — Telephone Encounter (Signed)
Lab orders have been faxed to Dr. Yetta Flock office.

## 2019-03-26 ENCOUNTER — Ambulatory Visit: Payer: BC Managed Care – PPO | Admitting: Rheumatology

## 2019-03-30 ENCOUNTER — Other Ambulatory Visit: Payer: Self-pay | Admitting: Rheumatology

## 2019-03-31 NOTE — Telephone Encounter (Signed)
Ok to refill 30 day supply of MTX

## 2019-03-31 NOTE — Telephone Encounter (Signed)
Last Visit: 10/23/18 Next Visit: 05/07/19 Labs: 12/17/18 total protein 5.7 albumin 3.7 all other labs WNL  Left message with patient's spouse to advise she needs to update labs. He advised that patient is going Wednesday to update labs. He will remind patient to have th results faxed to office.   Okay to refill 30 day supply MTX?

## 2019-04-16 ENCOUNTER — Telehealth: Payer: Self-pay | Admitting: Rheumatology

## 2019-04-16 NOTE — Telephone Encounter (Signed)
Attempted to contact the patient. No answer and unable to leave a message.  

## 2019-04-16 NOTE — Telephone Encounter (Signed)
Patient calling to see if we received lab results from Dr. Yetta Flock office. (PCP) Patient had labs drawn 2 weeks ago. Please call to advise.

## 2019-04-16 NOTE — Telephone Encounter (Signed)
Attempted to contact the patient. No answer and voicemail not set up, unable to leave a message.

## 2019-04-17 NOTE — Telephone Encounter (Signed)
Attempted to contact the patient. No answer and unable to leave a message. No answer and unable to leave a voicemail at home number.

## 2019-04-17 NOTE — Telephone Encounter (Signed)
Attempted to contact the patient. No answer and unable to leave a message.  

## 2019-04-21 ENCOUNTER — Telehealth: Payer: Self-pay | Admitting: Rheumatology

## 2019-04-21 NOTE — Telephone Encounter (Signed)
Patient left a voicemail stating she left a message last week to check if the office had received her labwork results from Dr. Yetta Flock office.  Patient is requesting a return call.

## 2019-04-21 NOTE — Telephone Encounter (Signed)
Patient advised we have not received lab results. Patient states PCP will resend the results.

## 2019-04-22 ENCOUNTER — Telehealth: Payer: Self-pay | Admitting: *Deleted

## 2019-04-22 NOTE — Telephone Encounter (Signed)
Received labs results from Dr. Abner Greenspan drawn on 04/02/19  CBC/CMP and Cholesterol Panel.   Potassium 5.4, Chloride 107. All other results WNL  Reviewed by Sherron Ales, PA-C. Per her note, notify patient potassium is elevated and to discuss with PCP.  Attempted to contact the patient and left message for patient to call the office.

## 2019-04-23 ENCOUNTER — Telehealth: Payer: Self-pay | Admitting: Rheumatology

## 2019-04-23 NOTE — Telephone Encounter (Signed)
Attempted to contact patient and left message on machine to advise patient that Sherron Ales, PA-C reviewed the labs and her potassium is elevated and she would like patient to discuss that with her PCP.

## 2019-04-23 NOTE — Telephone Encounter (Signed)
Patient called stating she was returning  Andrea's call regarding her labwork results. 

## 2019-05-01 NOTE — Progress Notes (Signed)
Office Visit Note  Patient: Cathy Casey             Date of Birth: Jul 29, 1960           MRN: [id number]             PCP: Abner Greenspan, MD Referring: Abner Greenspan, MD Visit Date: 05/07/2019 Occupation: @GUAROCC @  Subjective:  Pain in both knees.   History of Present Illness: Cathy Casey is a 59 y.o. female with a past medical history of seropositive rheumatoid arthritis, osteoarthritis, and degenerative disc disease. She states that overall her joints are doing well and she denies any inflammation or swelling at this time. She does endorse bilateral knee pain that is worse with movement and performing activities. She currently receives Synvisc injections and is due to receive an injection soon. Her last injection was February/March of 2020. Patient states that these knee injections help significantly. She also takes Mobic 15 mg PO as needed for pain. She has not needed to take this medication recently. She is currently on Methotrexate 3 tablets by mouth once weekly and folic acid 1 mg by mouth daily and is tolerating them well. She experiences occasional bilateral hand stiffness with prolonged activity or cold weather changes.   Activities of Daily Living:  Patient reports morning stiffness for 0 minutes.   Patient Denies nocturnal pain.  Difficulty dressing/grooming: Denies Difficulty climbing stairs: Reports Difficulty getting out of chair: Reports Difficulty using hands for taps, buttons, cutlery, and/or writing: Denies  Review of Systems  Constitutional: Positive for fatigue. Negative for night sweats, weight gain and weight loss.  HENT: Negative for mouth sores, trouble swallowing, trouble swallowing, mouth dryness and nose dryness.   Eyes: Negative for pain, redness, visual disturbance and dryness.  Respiratory: Negative for cough, shortness of breath and difficulty breathing.   Cardiovascular: Negative for chest pain, palpitations, hypertension, irregular heartbeat and swelling  in legs/feet.  Gastrointestinal: Positive for constipation and diarrhea. Negative for abdominal pain and blood in stool.  Endocrine: Negative for cold intolerance and increased urination.  Genitourinary: Negative for difficulty urinating, nocturia and vaginal dryness.  Musculoskeletal: Positive for arthralgias and joint pain. Negative for joint swelling, myalgias, muscle weakness, morning stiffness, muscle tenderness and myalgias.  Skin: Negative for color change, rash, hair loss, skin tightness, ulcers and sensitivity to sunlight.  Allergic/Immunologic: Negative for susceptible to infections.  Neurological: Negative for dizziness, numbness, memory loss, night sweats and weakness.  Hematological: Negative for bruising/bleeding tendency and swollen glands.  Psychiatric/Behavioral: Positive for sleep disturbance. Negative for depressed mood. The patient is not nervous/anxious.     PMFS History:  Patient Active Problem List   Diagnosis Date Noted  . Rheumatoid arthritis with rheumatoid factor of multiple sites without organ or systems involvement (HCC) 04/03/2016  . High risk medications (not anticoagulants) long-term use 04/03/2016  . Primary osteoarthritis of both knees 03/29/2016  . Chronic pain of both knees 03/29/2016    Past Medical History:  Diagnosis Date  . Overactive bladder   . Rheumatoid arthritis (HCC)     Family History  Problem Relation Age of Onset  . Diabetes Mother   . Rheum arthritis Sister   . Diabetes Sister   . Healthy Son   . Healthy Son   . Healthy Son    Past Surgical History:  Procedure Laterality Date  . ABDOMINAL HYSTERECTOMY    . CHOLECYSTECTOMY     Social History   Social History Narrative  . Not on file  There is no immunization history on file for this patient.   Objective: Vital Signs: BP 125/81 (BP Location: Left Arm, Patient Position: Sitting, Cuff Size: Small)   Pulse 71   Resp 12   Ht 5\' 5"  (1.651 m)   Wt 143 lb 3.2 oz (65 kg)    BMI 23.83 kg/m    Physical Exam Vitals and nursing note reviewed.  Constitutional:      Appearance: She is well-developed.  HENT:     Head: Normocephalic and atraumatic.  Eyes:     Conjunctiva/sclera: Conjunctivae normal.  Cardiovascular:     Rate and Rhythm: Normal rate and regular rhythm.     Heart sounds: Normal heart sounds.  Pulmonary:     Effort: Pulmonary effort is normal.     Breath sounds: Normal breath sounds.  Abdominal:     General: Bowel sounds are normal.     Palpations: Abdomen is soft.  Musculoskeletal:     Cervical back: Normal range of motion.  Lymphadenopathy:     Cervical: No cervical adenopathy.  Skin:    General: Skin is warm and dry.     Capillary Refill: Capillary refill takes less than 2 seconds.  Neurological:     Mental Status: She is alert and oriented to person, place, and time.  Psychiatric:        Behavior: Behavior normal.      Musculoskeletal Exam: C-spine thoracic and lumbar spine with good range of motion.  Shoulder joints elbow joints wrist joint MCPs PIPs DIPs with good range of motion with no synovitis.  Hip joints knee joints ankles MTPs PIPs with good range of motion with no synovitis.  She has some discomfort range of motion of her bilateral knee joints.  No warmth swelling or effusion was noted.  CDAI Exam: CDAI Score: 0.2  Patient Global: 1 mm; Provider Global: 1 mm Swollen: 0 ; Tender: 0  Joint Exam 05/07/2019   No joint exam has been documented for this visit   There is currently no information documented on the homunculus. Go to the Rheumatology activity and complete the homunculus joint exam.  Investigation: No additional findings.  Imaging: No results found.  Recent Labs: Lab Results  Component Value Date   WBC 5.9 04/08/2018   HGB 12.9 04/08/2018   PLT 314 04/08/2018   NA 142 04/08/2018   K 5.6 (H) 04/08/2018   CL 108 04/08/2018   CO2 28 04/08/2018   GLUCOSE 74 04/08/2018   BUN 10 04/08/2018   CREATININE  0.82 04/08/2018   BILITOT 0.4 04/08/2018   ALKPHOS 96 09/01/2016   AST 28 04/08/2018   ALT 22 04/08/2018   PROT 6.1 04/08/2018   ALBUMIN 4.1 09/01/2016   CALCIUM 9.3 04/08/2018   GFRAA 92 04/08/2018    Speciality Comments: No specialty comments available.  Procedures:  No procedures performed Allergies: Patient has no known allergies.   Assessment / Plan:     Visit Diagnoses: Rheumatoid arthritis with rheumatoid factor of multiple sites without organ or systems involvement (HCC)-patient is clinically doing well with no synovitis on examination.  High risk medication use - MTX 3 tablets po once weekly and folic acid 1 mg po daily.  Her labs were normal in January.  We will continue to monitor labs every 3 months.  Primary osteoarthritis of both knees - She had bilateral Synvisc injections at the end of February and early March 2020.  She had good response to Visco supplement injections.  Some of the  discomfort is coming back.  She would like to schedule another round of Visco supplement injections.  We will get x-rays of bilateral knee joints today..  X-rays revealed bilateral moderate osteoarthritis and moderate chondromalacia patella.  Need for regular exercise and muscle strengthening was discussed.  Primary osteoarthritis of both feet-she has been having off-and-on discomfort.  Proper fitting shoes were discussed.  DDD (degenerative disc disease), lumbar-she has chronic discomfort off and on.  Plantar fasciitis - Resolved.   Overactive bladder  Chronic pain of both knees - Plan: XR KNEE 3 VIEW RIGHT, XR KNEE 3 VIEW LEFT  Orders: Orders Placed This Encounter  Procedures  . XR KNEE 3 VIEW RIGHT  . XR KNEE 3 VIEW LEFT   No orders of the defined types were placed in this encounter.    Follow-Up Instructions: Return in about 5 months (around 10/04/2019) for Rheumatoid arthritis.   Bo Merino, MD  Note - This record has been created using Editor, commissioning.  Chart  creation errors have been sought, but may not always  have been located. Such creation errors do not reflect on  the standard of medical care.

## 2019-05-07 ENCOUNTER — Ambulatory Visit: Payer: BC Managed Care – PPO | Admitting: Rheumatology

## 2019-05-07 ENCOUNTER — Ambulatory Visit: Payer: Self-pay

## 2019-05-07 ENCOUNTER — Telehealth: Payer: Self-pay

## 2019-05-07 ENCOUNTER — Other Ambulatory Visit: Payer: Self-pay

## 2019-05-07 ENCOUNTER — Encounter: Payer: Self-pay | Admitting: Rheumatology

## 2019-05-07 VITALS — BP 125/81 | HR 71 | Resp 12 | Ht 65.0 in | Wt 143.2 lb

## 2019-05-07 DIAGNOSIS — M5136 Other intervertebral disc degeneration, lumbar region: Secondary | ICD-10-CM

## 2019-05-07 DIAGNOSIS — M25562 Pain in left knee: Secondary | ICD-10-CM

## 2019-05-07 DIAGNOSIS — M25561 Pain in right knee: Secondary | ICD-10-CM | POA: Diagnosis not present

## 2019-05-07 DIAGNOSIS — G8929 Other chronic pain: Secondary | ICD-10-CM

## 2019-05-07 DIAGNOSIS — M170 Bilateral primary osteoarthritis of knee: Secondary | ICD-10-CM

## 2019-05-07 DIAGNOSIS — M0579 Rheumatoid arthritis with rheumatoid factor of multiple sites without organ or systems involvement: Secondary | ICD-10-CM

## 2019-05-07 DIAGNOSIS — M722 Plantar fascial fibromatosis: Secondary | ICD-10-CM

## 2019-05-07 DIAGNOSIS — M19071 Primary osteoarthritis, right ankle and foot: Secondary | ICD-10-CM | POA: Diagnosis not present

## 2019-05-07 DIAGNOSIS — Z79899 Other long term (current) drug therapy: Secondary | ICD-10-CM | POA: Diagnosis not present

## 2019-05-07 DIAGNOSIS — N3281 Overactive bladder: Secondary | ICD-10-CM

## 2019-05-07 DIAGNOSIS — M19072 Primary osteoarthritis, left ankle and foot: Secondary | ICD-10-CM

## 2019-05-07 NOTE — Patient Instructions (Signed)
Standing Labs We placed an order today for your standing lab work.    Please come back and get your standing labs in purulent every 3 months  We have open lab daily Monday through Thursday from 8:30-12:30 PM and 1:30-4:30 PM and Friday from 8:30-12:30 PM and 1:30-4:00 PM at the office of Dr. Pollyann Savoy.   You may experience shorter wait times on Monday and Friday afternoons. The office is located at [address], [place], [place] [address] No appointment is necessary.   Labs are drawn by [facility].  You may receive a bill from [facility] for your lab work.  If you wish to have your labs drawn at another location, please call the office 24 hours in advance to send orders.  If you have any questions regarding directions or hours of operation,  please call [phone].   Just as a reminder please drink plenty of water prior to coming for your lab work. Thanks!

## 2019-05-07 NOTE — Telephone Encounter (Signed)
Please apply for bilateral knee visco, per Dr. Deveshwar. Thanks!  

## 2019-05-13 ENCOUNTER — Other Ambulatory Visit: Payer: Self-pay | Admitting: *Deleted

## 2019-05-13 MED ORDER — METHOTREXATE SODIUM 2.5 MG PO TABS: ORAL_TABLET | ORAL | 0 refills | Status: DC

## 2019-05-13 NOTE — Telephone Encounter (Signed)
Refill request received via fax  Last Visit: 05/07/19 Next Visit: 10/02/19 Labs: 04/02/19 Potassium 5.4, Chloride 107  Okay to refill per Dr. Corliss Skains

## 2019-05-14 NOTE — Telephone Encounter (Signed)
Submitted VOB 05/14/2019. 

## 2019-06-25 NOTE — Telephone Encounter (Addendum)
Please call to schedule Visco knee injections.  Authorized for [employer] series Bilateral Knees. Authorization for Euflexxa # [id number] (effec. 06/17/2019- 06/16/2020) Sharyn Lull and Bill Deductible does not apply. Once OOP is met, covered at 100% of allowable amount. ( as of date $318.29 of $4890.00 has been met) Co- pay of $80.00 each visit

## 2019-06-30 ENCOUNTER — Telehealth: Payer: Self-pay | Admitting: Rheumatology

## 2019-06-30 NOTE — Telephone Encounter (Signed)
John from [employer] left a voicemail stating a prior authorization is required for patient's Synvisc which hasn't been submitted yet.  Please call when you receive prior authorization at #[phone] or you can update the portal.

## 2019-07-21 ENCOUNTER — Other Ambulatory Visit: Payer: Self-pay

## 2019-07-21 ENCOUNTER — Ambulatory Visit: Payer: BC Managed Care – PPO | Admitting: Physician Assistant

## 2019-07-21 DIAGNOSIS — M170 Bilateral primary osteoarthritis of knee: Secondary | ICD-10-CM | POA: Diagnosis not present

## 2019-07-21 DIAGNOSIS — M1711 Unilateral primary osteoarthritis, right knee: Secondary | ICD-10-CM | POA: Diagnosis not present

## 2019-07-21 DIAGNOSIS — M1712 Unilateral primary osteoarthritis, left knee: Secondary | ICD-10-CM

## 2019-07-21 DIAGNOSIS — Z79899 Other long term (current) drug therapy: Secondary | ICD-10-CM

## 2019-07-21 MED ORDER — SODIUM HYALURONATE (VISCOSUP) 20 MG/2ML IX SOSY
20.0000 mg | PREFILLED_SYRINGE | INTRA_ARTICULAR | Status: AC | PRN
  Administered 2019-07-21: 20 mg via INTRA_ARTICULAR

## 2019-07-21 MED ORDER — LIDOCAINE HCL 1 % IJ SOLN
1.5000 mL | INTRAMUSCULAR | Status: AC | PRN
  Administered 2019-07-21: 1.5 mL

## 2019-07-21 NOTE — Progress Notes (Signed)
   Procedure Note  Patient: Cathy Casey             Date of Birth: [birth year]           MRN: [id number]             Visit Date: 07/21/2019  Procedures: Visit Diagnoses:  1. Primary osteoarthritis of both knees    Euflexxa #1 Bilateral knee joint injections  Large Joint Inj: bilateral knee on 07/21/2019 8:10 AM Indications: pain Details: 27 G 1.5 in needle, medial approach  Arthrogram: No  Medications (Right): 1.5 mL lidocaine 1 %; 20 mg Sodium Hyaluronate 20 MG/2ML Aspirate (Right): 0 mL Medications (Left): 1.5 mL lidocaine 1 %; 20 mg Sodium Hyaluronate 20 MG/2ML Aspirate (Left): 0 mL Outcome: tolerated well, no immediate complications Procedure, treatment alternatives, risks and benefits explained, specific risks discussed. Consent was given by the patient. Immediately prior to procedure a time out was called to verify the correct patient, procedure, equipment, support staff and site/side marked as required. Patient was prepped and draped in the usual sterile fashion.      Patient tolerated the procedure well. Aftercare was discussed.  Sherron Ales, PA-C

## 2019-07-22 LAB — COMPLETE METABOLIC PANEL WITH GFR
AG Ratio: 1.9 (calc) (ref 1.0–2.5)
ALT: 18 U/L (ref 6–29)
AST: 21 U/L (ref 10–35)
Albumin: 3.9 g/dL (ref 3.6–5.1)
Alkaline phosphatase (APISO): 77 U/L (ref 37–153)
BUN: 11 mg/dL (ref 7–25)
CO2: 27 mmol/L (ref 20–32)
Calcium: 9 mg/dL (ref 8.6–10.4)
Chloride: 106 mmol/L (ref 98–110)
Creat: 0.77 mg/dL (ref 0.50–1.05)
GFR, Est African American: 99 mL/min/{1.73_m2} (ref >=60)
GFR, Est Non African American: 85 mL/min/{1.73_m2} (ref >=60)
Globulin: 2.1 g/dL (calc) (ref 1.9–3.7)
Glucose, Bld: 74 mg/dL (ref 65–99)
Potassium: 4.1 mmol/L (ref 3.5–5.3)
Sodium: 141 mmol/L (ref 135–146)
Total Bilirubin: 0.4 mg/dL (ref 0.2–1.2)
Total Protein: 6 g/dL — ABNORMAL LOW (ref 6.1–8.1)

## 2019-07-22 LAB — CBC WITH DIFFERENTIAL/PLATELET
Absolute Monocytes: 666 cells/uL (ref 200–950)
Basophils Absolute: 50 cells/uL (ref 0–200)
Basophils Relative: 0.9 %
Eosinophils Absolute: 121 cells/uL (ref 15–500)
Eosinophils Relative: 2.2 %
HCT: 36.7 % (ref 35.0–45.0)
Hemoglobin: 12 g/dL (ref 11.7–15.5)
Lymphs Abs: 2354 cells/uL (ref 850–3900)
MCH: 29.9 pg (ref 27.0–33.0)
MCHC: 32.7 g/dL (ref 32.0–36.0)
MCV: 91.5 fL (ref 80.0–100.0)
MPV: 11.1 fL (ref 7.5–12.5)
Monocytes Relative: 12.1 %
Neutro Abs: 2310 cells/uL (ref 1500–7800)
Neutrophils Relative %: 42 %
Platelets: 266 10*3/uL (ref 140–400)
RBC: 4.01 10*6/uL (ref 3.80–5.10)
RDW: 12.6 % (ref 11.0–15.0)
Total Lymphocyte: 42.8 %
WBC: 5.5 10*3/uL (ref 3.8–10.8)

## 2019-07-28 ENCOUNTER — Other Ambulatory Visit: Payer: Self-pay

## 2019-07-28 ENCOUNTER — Ambulatory Visit: Payer: BC Managed Care – PPO | Admitting: Physician Assistant

## 2019-07-28 DIAGNOSIS — M170 Bilateral primary osteoarthritis of knee: Secondary | ICD-10-CM | POA: Diagnosis not present

## 2019-07-28 DIAGNOSIS — M1712 Unilateral primary osteoarthritis, left knee: Secondary | ICD-10-CM

## 2019-07-28 DIAGNOSIS — M1711 Unilateral primary osteoarthritis, right knee: Secondary | ICD-10-CM | POA: Diagnosis not present

## 2019-07-28 MED ORDER — SODIUM HYALURONATE (VISCOSUP) 20 MG/2ML IX SOSY
20.0000 mg | PREFILLED_SYRINGE | INTRA_ARTICULAR | Status: AC | PRN
  Administered 2019-07-28: 20 mg via INTRA_ARTICULAR

## 2019-07-28 MED ORDER — LIDOCAINE HCL 1 % IJ SOLN
1.5000 mL | INTRAMUSCULAR | Status: AC | PRN
  Administered 2019-07-28: 1.5 mL

## 2019-07-28 NOTE — Progress Notes (Signed)
   Procedure Note  Patient: Cathy Casey             Date of Birth: 10-Apr-1960           MRN: [id number]             Visit Date: 07/28/2019  Procedures: Visit Diagnoses:  1. Primary osteoarthritis of both knees   Euflexxa #2 bilateral knee joint injections   Large Joint Inj: bilateral knee on 07/28/2019 2:19 PM Indications: pain Details: 27 G 1.5 in needle, medial approach  Arthrogram: No  Medications (Right): 1.5 mL lidocaine 1 %; 20 mg Sodium Hyaluronate 20 MG/2ML Aspirate (Right): 0 mL Medications (Left): 1.5 mL lidocaine 1 %; 20 mg Sodium Hyaluronate 20 MG/2ML Aspirate (Left): 0 mL Outcome: tolerated well, no immediate complications Procedure, treatment alternatives, risks and benefits explained, specific risks discussed. Consent was given by the patient. Immediately prior to procedure a time out was called to verify the correct patient, procedure, equipment, support staff and site/side marked as required. Patient was prepped and draped in the usual sterile fashion.    Patient tolerated the procedure well.  Aftercare was discussed.  Sherron Ales, PA-C

## 2019-08-04 ENCOUNTER — Telehealth: Payer: Self-pay | Admitting: Rheumatology

## 2019-08-04 ENCOUNTER — Other Ambulatory Visit: Payer: Self-pay

## 2019-08-04 ENCOUNTER — Ambulatory Visit: Payer: BC Managed Care – PPO | Admitting: Physician Assistant

## 2019-08-04 DIAGNOSIS — M1712 Unilateral primary osteoarthritis, left knee: Secondary | ICD-10-CM

## 2019-08-04 DIAGNOSIS — M1711 Unilateral primary osteoarthritis, right knee: Secondary | ICD-10-CM | POA: Diagnosis not present

## 2019-08-04 DIAGNOSIS — M170 Bilateral primary osteoarthritis of knee: Secondary | ICD-10-CM

## 2019-08-04 MED ORDER — SODIUM HYALURONATE (VISCOSUP) 20 MG/2ML IX SOSY
20.0000 mg | PREFILLED_SYRINGE | INTRA_ARTICULAR | Status: AC | PRN
  Administered 2019-08-04: 20 mg via INTRA_ARTICULAR

## 2019-08-04 MED ORDER — LIDOCAINE HCL 1 % IJ SOLN
1.5000 mL | INTRAMUSCULAR | Status: AC | PRN
  Administered 2019-08-04: 1.5 mL

## 2019-08-04 NOTE — Telephone Encounter (Signed)
Patient request last lab results to be sent to Dr. Abner Greenspan, her PCP.

## 2019-08-04 NOTE — Progress Notes (Signed)
   Procedure Note  Patient: Cathy Casey             Date of Birth: 11/22/1960           MRN: [id number]             Visit Date: 08/04/2019  Procedures: Visit Diagnoses:  1. Primary osteoarthritis of both knees    Euflexxa #3 Bilateral knee joint injections  Large Joint Inj: bilateral knee on 08/04/2019 8:33 AM Indications: pain Details: 27 G 1.5 in needle, medial approach  Arthrogram: No  Medications (Right): 1.5 mL lidocaine 1 %; 20 mg Sodium Hyaluronate 20 MG/2ML Aspirate (Right): 0 mL Medications (Left): 1.5 mL lidocaine 1 %; 20 mg Sodium Hyaluronate 20 MG/2ML Aspirate (Left): 0 mL Outcome: tolerated well, no immediate complications Procedure, treatment alternatives, risks and benefits explained, specific risks discussed. Consent was given by the patient. Immediately prior to procedure a time out was called to verify the correct patient, procedure, equipment, support staff and site/side marked as required. Patient was prepped and draped in the usual sterile fashion.      Patient tolerated the procedure well. Aftercare was discussed.  Sherron Ales, PA-C

## 2019-08-04 NOTE — Telephone Encounter (Signed)
Recent lab results sent to PCP.

## 2019-08-21 ENCOUNTER — Other Ambulatory Visit: Payer: Self-pay | Admitting: *Deleted

## 2019-08-21 MED ORDER — METHOTREXATE SODIUM 2.5 MG PO TABS: ORAL_TABLET | ORAL | 0 refills | Status: DC

## 2019-08-21 NOTE — Telephone Encounter (Signed)
Refill request received via fax  Last Visit: 05/07/2019 Next Visit: 10/02/2019 Labs: 07/21/2019 Total protein borderline low-6.0. Rest of CMP WNL. CBC WNL.  Current Dose per office note on 05/07/2019:MTX 3 tablets po once weekly  Okay to refill per Dr. Corliss Skains

## 2019-09-23 NOTE — Progress Notes (Deleted)
Office Visit Note  Patient: Cathy Casey             Date of Birth: September 25, 1960           MRN: [id number]             PCP: Abner Greenspan, MD Referring: Abner Greenspan, MD Visit Date: 10/02/2019 Occupation: @GUAROCC @  Subjective:  No chief complaint on file.   History of Present Illness: Cathy Casey is a 59 y.o. female ***   Activities of Daily Living:  Patient reports morning stiffness for *** {minute/hour:19697}.   Patient {ACTIONS;DENIES/REPORTS:21021675::"Denies"} nocturnal pain.  Difficulty dressing/grooming: {ACTIONS;DENIES/REPORTS:21021675::"Denies"} Difficulty climbing stairs: {ACTIONS;DENIES/REPORTS:21021675::"Denies"} Difficulty getting out of chair: {ACTIONS;DENIES/REPORTS:21021675::"Denies"} Difficulty using hands for taps, buttons, cutlery, and/or writing: {ACTIONS;DENIES/REPORTS:21021675::"Denies"}  No Rheumatology ROS completed.   PMFS History:  Patient Active Problem List   Diagnosis Date Noted  . Rheumatoid arthritis with rheumatoid factor of multiple sites without organ or systems involvement (HCC) 04/03/2016  . High risk medications (not anticoagulants) long-term use 04/03/2016  . Primary osteoarthritis of both knees 03/29/2016  . Chronic pain of both knees 03/29/2016    Past Medical History:  Diagnosis Date  . Overactive bladder   . Rheumatoid arthritis (HCC)     Family History  Problem Relation Age of Onset  . Diabetes Mother   . Rheum arthritis Sister   . Diabetes Sister   . Healthy Son   . Healthy Son   . Healthy Son    Past Surgical History:  Procedure Laterality Date  . ABDOMINAL HYSTERECTOMY    . CHOLECYSTECTOMY     Social History   Social History Narrative  . Not on file    There is no immunization history on file for this patient.   Objective: Vital Signs: There were no vitals taken for this visit.   Physical Exam   Musculoskeletal Exam: ***  CDAI Exam: CDAI Score: -- Patient Global: --; Provider Global: -- Swollen: --;  Tender: -- Joint Exam 10/02/2019   No joint exam has been documented for this visit   There is currently no information documented on the homunculus. Go to the Rheumatology activity and complete the homunculus joint exam.  Investigation: No additional findings.  Imaging: No results found.  Recent Labs: Lab Results  Component Value Date   WBC 5.5 07/21/2019   HGB 12.0 07/21/2019   PLT 266 07/21/2019   NA 141 07/21/2019   K 4.1 07/21/2019   CL 106 07/21/2019   CO2 27 07/21/2019   GLUCOSE 74 07/21/2019   BUN 11 07/21/2019   CREATININE 0.77 07/21/2019   BILITOT 0.4 07/21/2019   ALKPHOS 96 09/01/2016   AST 21 07/21/2019   ALT 18 07/21/2019   PROT 6.0 (L) 07/21/2019   ALBUMIN 4.1 09/01/2016   CALCIUM 9.0 07/21/2019   GFRAA 99 07/21/2019    Speciality Comments: No specialty comments available.  Procedures:  No procedures performed Allergies: Patient has no known allergies.   Assessment / Plan:     Visit Diagnoses: No diagnosis found.  Orders: No orders of the defined types were placed in this encounter.  No orders of the defined types were placed in this encounter.   Face-to-face time spent with patient was *** minutes. Greater than 50% of time was spent in counseling and coordination of care.  Follow-Up Instructions: No follow-ups on file.   09/20/2019, CMA  Note - This record has been created using Ellen Henri.  Chart creation errors have been sought, but may not  always  have been located. Such creation errors do not reflect on  the standard of medical care.

## 2019-09-30 ENCOUNTER — Telehealth: Payer: Self-pay | Admitting: Rheumatology

## 2019-09-30 DIAGNOSIS — Z79899 Other long term (current) drug therapy: Secondary | ICD-10-CM

## 2019-09-30 NOTE — Telephone Encounter (Signed)
Lab orders have been faxed to Dr. Yetta Flock' office. Called patient and she is aware.

## 2019-09-30 NOTE — Telephone Encounter (Signed)
Patient requested her labwork orders be sent to Dr. Abner Greenspan.   Patient states she is due for labwork in August and was told they can keep the orders until her appointment.

## 2019-10-02 ENCOUNTER — Ambulatory Visit: Payer: BC Managed Care – PPO | Admitting: Rheumatology

## 2019-10-16 ENCOUNTER — Telehealth: Payer: Self-pay | Admitting: Rheumatology

## 2019-10-16 NOTE — Telephone Encounter (Signed)
Patient left a voicemail checking the status of her labwork orders that she requested be faxed to Dr. Abner Greenspan at [facility] in [place].  Patient states she requested they be sent so she could have her labwork at their office in August.  Patient requested a return call.

## 2019-10-16 NOTE — Telephone Encounter (Signed)
Patient advised her labs orders were faxed on 09/30/2019. Patient states Dr. Yetta Flock office has advised her they have not received them. Re-faxed the order to Dr. Yetta Flock.

## 2019-10-24 ENCOUNTER — Telehealth: Payer: Self-pay | Admitting: Rheumatology

## 2019-10-24 NOTE — Telephone Encounter (Signed)
Patient left a message requesting a call back. Dr. Yetta Flock office was to send patients lab results. Patient would like to know if we received them. Please call patient to advise.

## 2019-10-24 NOTE — Telephone Encounter (Signed)
Left message for patient to advise we have not received her lab results from Dr. Yetta Flock office.

## 2019-10-30 ENCOUNTER — Other Ambulatory Visit: Payer: Self-pay | Admitting: Rheumatology

## 2019-10-30 ENCOUNTER — Telehealth: Payer: Self-pay | Admitting: *Deleted

## 2019-10-30 NOTE — Telephone Encounter (Addendum)
Last Visit: 05/07/2019 Next Visit: 10/02/2019 Labs: 10/21/2019 CBC WNL, 09/04/2019 CMP: Chloride 107  Current Dose per office note on 05/07/2019:MTX 3 tablets po once weekly  Okay to refill MTX?

## 2019-10-30 NOTE — Telephone Encounter (Signed)
Lab results received from [facility] Reviewed by Dr. Corliss Skains   CBC 10/20/2019 WNL CMP 09/04/2019 Chloride 107

## 2019-10-31 ENCOUNTER — Telehealth: Payer: Self-pay | Admitting: Rheumatology

## 2019-10-31 NOTE — Telephone Encounter (Signed)
Patient advised we have received her labs from Dr. Ethelene Hal.

## 2019-10-31 NOTE — Telephone Encounter (Signed)
Patient left a voicemail checking if we received her labwork from Dr. Yetta Flock in [place].  Patient is requesting a return call to let her know.

## 2019-11-13 NOTE — Progress Notes (Signed)
Office Visit Note  Patient: Cathy Casey             Date of Birth: 10/24/1960           MRN: [id number]             PCP: Abner Greenspan, MD Referring: Abner Greenspan, MD Visit Date: 11/26/2019 Occupation: @GUAROCC @  Subjective:  Medication monitoring   History of Present Illness: Cathy Casey is a 59 y.o. female with history of seropositive rheumatoid arthritis, osteoarthritis, and DDD.  She is taking methotrexate 3 tablets by mouth once weekly and folic acid 1 mg po daily.  She denies missing any doses of methotrexate recently.  She denies any recent rheumatoid arthritis flares.  She says she has occasional discomfort in both knee joints but states that her discomfort has improved significantly since having Visco gel injections performed in May 2021.  She denies any joint swelling at this time.  She states that she experiences intermittent discomfort and stiffness in both hands in both feet but denies any joint swelling and feels the most of her discomfort is due to underlying osteoarthritis.  She takes meloxicam 15 mg 1 tablet by mouth daily as needed.  She typically tries to take meloxicam very sparingly. She denies any recent infections.  She has received both COVID-19 vaccinations and planning to receive her third dose.  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   Activities of Daily Living:  Patient reports morning stiffness for  5 minutes.   Patient Denies nocturnal pain.  Difficulty dressing/grooming: Denies Difficulty climbing stairs: Denies Difficulty getting out of chair: Denies Difficulty using hands for taps, buttons, cutlery, and/or writing: Denies  Review of Systems  Constitutional: Positive for fatigue.  HENT: Negative for mouth sores, mouth dryness and nose  dryness.   Eyes: Negative for pain, visual disturbance and dryness.  Respiratory: Negative for cough, hemoptysis, shortness of breath and difficulty breathing.   Cardiovascular: Negative for chest pain, palpitations, hypertension and swelling in legs/feet.  Gastrointestinal: Negative for blood in stool, constipation and diarrhea.  Endocrine: Negative for increased urination.  Genitourinary: Negative for painful urination.  Musculoskeletal: Positive for arthralgias, joint pain and morning stiffness. Negative for joint swelling, myalgias, muscle weakness, muscle tenderness and myalgias.  Skin: Negative for color change, pallor, rash, hair loss, nodules/bumps, skin tightness, ulcers and sensitivity to sunlight.  Allergic/Immunologic: Negative for susceptible to infections.  Neurological: Negative for dizziness, numbness, headaches and weakness.  Hematological: Negative for swollen glands.  Psychiatric/Behavioral: Positive for sleep disturbance. Negative for depressed mood. The patient is not nervous/anxious.     PMFS History:  Patient Active Problem List   Diagnosis Date Noted  . Rheumatoid arthritis with rheumatoid factor of multiple sites without organ or systems involvement (HCC) 04/03/2016  . High risk medications (not anticoagulants) long-term use 04/03/2016  . Primary osteoarthritis of both knees 03/29/2016  . Chronic pain of both knees 03/29/2016    Past Medical History:  Diagnosis Date  . Overactive bladder   . Rheumatoid arthritis (HCC)     Family History  Problem Relation Age of Onset  . Diabetes Mother   . Rheum arthritis Sister   . Diabetes Sister   . Healthy Son   . Healthy Son   . Healthy Son  Past Surgical History:  Procedure Laterality Date  . ABDOMINAL HYSTERECTOMY    . CHOLECYSTECTOMY     Social History   Social History Narrative  . Not on file   Immunization History  Administered Date(s) Administered  . Moderna SARS-COVID-2 Vaccination 05/21/2019,  06/18/2019     Objective: Vital Signs: BP (!) 142/88 (BP Location: Right Arm, Patient Position: Sitting, Cuff Size: Normal)   Pulse 66   Resp 13   Ht 5\' 5"  (1.651 m)   Wt 139 lb 9.6 oz (63.3 kg)   BMI 23.23 kg/m    Physical Exam Vitals and nursing note reviewed.  Constitutional:      Appearance: She is well-developed.  HENT:     Head: Normocephalic and atraumatic.  Eyes:     Conjunctiva/sclera: Conjunctivae normal.  Pulmonary:     Effort: Pulmonary effort is normal.  Abdominal:     Palpations: Abdomen is soft.  Musculoskeletal:     Cervical back: Normal range of motion.  Lymphadenopathy:     Cervical: No cervical adenopathy.  Skin:    General: Skin is warm and dry.     Capillary Refill: Capillary refill takes less than 2 seconds.  Neurological:     Mental Status: She is alert and oriented to person, place, and time.  Psychiatric:        Behavior: Behavior normal.      Musculoskeletal Exam: C-spine, thoracic spine, lumbar spine have good range of motion.  Shoulder joints, elbow joints, wrist joints, MCPs, PIPs, DIPs have good range of motion with no synovitis.  She has complete fist formation bilaterally.  Mild PIP and DIP thickening consistent with osteoarthritis of both hands noted.  Hip joints have good range of motion with no discomfort.  No tenderness over trochanteric bursa bilaterally.  Knee joints have good range of motion with no warmth or effusion.  No Baker's cyst palpable.  Ankle joints have good range of motion with no tenderness or inflammation.  No Achilles tendinitis or plantar fasciitis.  No tenderness of MTP joints.  She has mild PIP and DIP thickening consistent with osteoarthritis of both feet.  CDAI Exam: CDAI Score: 0.2  Patient Global: 1 mm; Provider Global: 1 mm Swollen: 0 ; Tender: 0  Joint Exam 11/26/2019   No joint exam has been documented for this visit   There is currently no information documented on the homunculus. Go to the Rheumatology  activity and complete the homunculus joint exam.  Investigation: No additional findings.  Imaging: No results found.  Recent Labs: Lab Results  Component Value Date   WBC 5.5 07/21/2019   HGB 12.0 07/21/2019   PLT 266 07/21/2019   NA 141 07/21/2019   K 4.1 07/21/2019   CL 106 07/21/2019   CO2 27 07/21/2019   GLUCOSE 74 07/21/2019   BUN 11 07/21/2019   CREATININE 0.77 07/21/2019   BILITOT 0.4 07/21/2019   ALKPHOS 96 09/01/2016   AST 21 07/21/2019   ALT 18 07/21/2019   PROT 6.0 (L) 07/21/2019   ALBUMIN 4.1 09/01/2016   CALCIUM 9.0 07/21/2019   GFRAA 99 07/21/2019    Speciality Comments: No specialty comments available.  Procedures:  No procedures performed Allergies: Patient has no known allergies.   Assessment / Plan:     Visit Diagnoses: Rheumatoid arthritis with rheumatoid factor of multiple sites without organ or systems involvement [facility]): She has no joint tenderness or synovitis on exam.  She has not had any recent rheumatoid arthritis flares.  She  is clinically doing well on methotrexate 3 tablets by mouth once weekly and folic acid 1 mg by mouth daily.  She is tolerating methotrexate without any side effects and has not missed any doses recently.  She experiences occasional discomfort and stiffness in both hands, both knee joints, and both feet due to underlying osteoarthritis.  She takes meloxicam 15 mg 1 tablet daily very sparingly for pain relief.  We discussed the importance of trying to avoid taking NSAIDs and Tylenol while on methotrexate.  We will check CMP today.  She will continue taking methotrexate 3 tablets by mouth once weekly and folic acid 1 mg by mouth daily.  She was advised to notify us if she develops increased joint pain or joint swelling.  We will update hands and feet x-rays at her follow-up visit to track radiographic progression.  She will follow-up in the office in 5 months.   High risk medication use - Methotrexate 3 tablets by mouth once weekly  and folic acid 1 mg po daily. CBC was WNL on 10/20/19.  CMP WNL on 09/04/19.  She is due to update CMP today.  Standing orders for CBC and CMP are in place. - Plan: COMPLETE METABOLIC PANEL WITH GFR She has received both COVID-19 vaccinations and is planning on receiving her third dose.  We discussed the importance of holding methotrexate 1 week after receiving the third dose.  She was also encouraged to avoid taking Tylenol and NSAIDs 24 hours prior to her third dose.  She was encouraged to continue to wear a mask and social distance.  She was given a work note recommending that she work in her [employer] instead of floating between classrooms due to her increased risk of contracting COVID-19 since she is immunosuppressed.  She was advised to notify us if she develops a COVID-19 infection in order to receive the antibody infusion.  She voiced understanding and all questions were addressed.   Primary osteoarthritis of both knees: She has good range of motion of both knee joints on exam.  No warmth or effusion was noted.  She is not experiencing any mechanical symptoms at this time.  She has occasional discomfort in both knees but overall her discomfort and stiffness have improved significantly since having Visco gel injections performed in May 2021.  She was advised to notify us when she would like to reapply for gel injections in the future.  Primary osteoarthritis of both feet: She experiences intermittent discomfort and stiffness in both feet.  She has not noticed any joint swelling.  She has no tenderness of MTP joints on exam.  She has mild osteoarthritic changes.  We will update x-rays at her follow-up visit.  DDD (degenerative disc disease), lumbar: She is not experiencing any discomfort in her lower back at this time.  Plantar fasciitis: Resolved.  Overactive bladder  Orders: Orders Placed This Encounter  Procedures  . COMPLETE METABOLIC PANEL WITH GFR   No orders of the defined  types were placed in this encounter.    Follow-Up Instructions: Return in about 5 months (around 04/27/2020) for Rheumatoid arthritis, Osteoarthritis.   Gearldine Bienenstock, PA-C  Note - This record has been created using Dragon software.  Chart creation errors have been sought, but may not always  have been located. Such creation errors do not reflect on  the standard of medical care.

## 2019-11-26 ENCOUNTER — Ambulatory Visit: Payer: BC Managed Care – PPO | Admitting: Physician Assistant

## 2019-11-26 ENCOUNTER — Encounter: Payer: Self-pay | Admitting: Physician Assistant

## 2019-11-26 ENCOUNTER — Other Ambulatory Visit: Payer: Self-pay

## 2019-11-26 VITALS — BP 142/88 | HR 66 | Resp 13 | Ht 65.0 in | Wt 139.6 lb

## 2019-11-26 DIAGNOSIS — M19072 Primary osteoarthritis, left ankle and foot: Secondary | ICD-10-CM

## 2019-11-26 DIAGNOSIS — M170 Bilateral primary osteoarthritis of knee: Secondary | ICD-10-CM

## 2019-11-26 DIAGNOSIS — N3281 Overactive bladder: Secondary | ICD-10-CM

## 2019-11-26 DIAGNOSIS — M722 Plantar fascial fibromatosis: Secondary | ICD-10-CM

## 2019-11-26 DIAGNOSIS — M19071 Primary osteoarthritis, right ankle and foot: Secondary | ICD-10-CM

## 2019-11-26 DIAGNOSIS — M0579 Rheumatoid arthritis with rheumatoid factor of multiple sites without organ or systems involvement: Secondary | ICD-10-CM | POA: Diagnosis not present

## 2019-11-26 DIAGNOSIS — M5136 Other intervertebral disc degeneration, lumbar region: Secondary | ICD-10-CM

## 2019-11-26 DIAGNOSIS — Z79899 Other long term (current) drug therapy: Secondary | ICD-10-CM

## 2019-11-26 NOTE — Progress Notes (Signed)
Reviewed CMP.

## 2019-11-26 NOTE — Patient Instructions (Signed)
COVID-19 vaccine recommendations:  ° °COVID-19 vaccine is recommended for everyone (unless you are allergic to a vaccine component), even if you are on a medication that suppresses your immune system.  ° °If you are on Methotrexate, Cellcept (mycophenolate), Rinvoq, Xeljanz, and Olumiant- hold the medication for 1 week after each vaccine. Hold Methotrexate for 2 weeks after the single dose COVID-19 vaccine.  ° °If you are on Orencia subcutaneous injection - hold medication one week prior to and one week after the first COVID-19 vaccine dose (only).  ° °If you are on Orencia IV infusions- time vaccination administration so that the first COVID-19 vaccination will occur four weeks after the infusion and postpone the subsequent infusion by one week.  ° °If you are on Cyclophosphamide or Rituxan infusions please contact your doctor prior to receiving the COVID-19 vaccine.  ° °Do not take Tylenol or any anti-inflammatory medications (NSAIDs) 24 hours prior to the COVID-19 vaccination.  ° °There is no direct evidence about the efficacy of the COVID-19 vaccine in individuals who are on medications that suppress the immune system.  ° °Even if you are fully vaccinated, and you are on any medications that suppress your immune system, please continue to wear a mask, maintain at least six feet social distance and practice hand hygiene.  ° °If you develop a COVID-19 infection, please contact your PCP or our office to determine if you need antibody infusion. ° °The booster vaccine is now available for immunocompromised patients. It is advised that if you had Pfizer vaccine you should get Pfizer booster.  If you had a Moderna vaccine then you should get a Moderna booster. Johnson and Johnson does not have a booster vaccine at this time. ° °Please see the following web sites for updated information.   ° °https://www.rheumatology.org/Portals/0/Files/COVID-19-Vaccination-Patient-Resources.pdf ° °https://www.rheumatology.org/About-Us/Newsroom/Press-Releases/ID/1159 ° °Standing Labs °We placed an order today for your standing lab work.  ° °Please have your standing labs drawn in November and every 3 months  ° °If possible, please have your labs drawn 2 weeks prior to your appointment so that the provider can discuss your results at your appointment. ° °We have open lab daily °Monday through Thursday from 8:30-12:30 PM and 1:30-4:30 PM and Friday from 8:30-12:30 PM and 1:30-4:00 PM °at the office of Dr. Shaili Deveshwar,  Rheumatology.   °Please be advised, patients with office appointments requiring lab work will take precedents over walk-in lab work.  °If possible, please come for your lab work on Monday and Friday afternoons, as you may experience shorter wait times. °The office is located at [address], [address], [place], [place] [address] °No appointment is necessary.   °Labs are drawn by [facility]. Please bring your co-pay at the time of your lab draw.  You may receive a bill from [facility] for your lab work. ° °If you wish to have your labs drawn at another location, please call the office 24 hours in advance to send orders. ° °If you have any questions regarding directions or hours of operation,  °please call [phone].   °As a reminder, please drink plenty of water prior to coming for your lab work. Thanks! ° ° °

## 2019-11-27 LAB — COMPLETE METABOLIC PANEL WITH GFR
AG Ratio: 1.8 (calc) (ref 1.0–2.5)
ALT: 24 U/L (ref 6–29)
AST: 25 U/L (ref 10–35)
Albumin: 4.2 g/dL (ref 3.6–5.1)
Alkaline phosphatase (APISO): 75 U/L (ref 37–153)
BUN: 15 mg/dL (ref 7–25)
CO2: 27 mmol/L (ref 20–32)
Calcium: 9.8 mg/dL (ref 8.6–10.4)
Chloride: 106 mmol/L (ref 98–110)
Creat: 0.87 mg/dL (ref 0.50–1.05)
GFR, Est African American: 85 mL/min/{1.73_m2} (ref >=60)
GFR, Est Non African American: 73 mL/min/{1.73_m2} (ref >=60)
Globulin: 2.3 g/dL (calc) (ref 1.9–3.7)
Glucose, Bld: 83 mg/dL (ref 65–99)
Potassium: 5 mmol/L (ref 3.5–5.3)
Sodium: 139 mmol/L (ref 135–146)
Total Bilirubin: 0.3 mg/dL (ref 0.2–1.2)
Total Protein: 6.5 g/dL (ref 6.1–8.1)

## 2019-11-27 NOTE — Progress Notes (Signed)
CMP WNL

## 2020-02-02 ENCOUNTER — Other Ambulatory Visit: Payer: Self-pay | Admitting: Rheumatology

## 2020-02-02 NOTE — Telephone Encounter (Signed)
Last Visit: 11/26/2019 Next Visit: 04/28/2020 Labs: 11/26/2019 CMP WNL CBC was WNL on 10/20/19  Current Dose per office note on 11/26/2019: Methotrexate 3 tablets by mouth once weekly  Dx: Rheumatoid arthritis with rheumatoid factor of multiple sites without organ or systems involvement   Okay to refill per Dr. Corliss Skains

## 2020-03-09 ENCOUNTER — Other Ambulatory Visit: Payer: Self-pay | Admitting: *Deleted

## 2020-03-09 ENCOUNTER — Telehealth: Payer: Self-pay

## 2020-03-09 DIAGNOSIS — Z79899 Other long term (current) drug therapy: Secondary | ICD-10-CM

## 2020-03-09 NOTE — Telephone Encounter (Signed)
Lab Orders released and faxed.  °

## 2020-03-09 NOTE — Telephone Encounter (Signed)
Patient called requesting her labwork orders be sent to Dr. Abner Greenspan.  Patient states she plans to go this week.

## 2020-03-11 ENCOUNTER — Telehealth: Payer: Self-pay

## 2020-03-11 NOTE — Telephone Encounter (Signed)
Labs orders re-faxed 

## 2020-03-11 NOTE — Telephone Encounter (Signed)
Patient called stating she called Dr. Yetta Flock office this morning to schedule a time to come in for labwork.  Patient states she was told they never received the orders from Dr. Corliss Skains.  Patient is requesting they be faxed ASAP to [facility].

## 2020-03-15 ENCOUNTER — Telehealth: Payer: Self-pay

## 2020-03-15 ENCOUNTER — Other Ambulatory Visit: Payer: Self-pay | Admitting: Rheumatology

## 2020-03-15 MED ORDER — FOLIC ACID 1 MG PO TABS: 1.0000 mg | ORAL_TABLET | Freq: Every day | ORAL | 4 refills | Status: DC

## 2020-03-15 NOTE — Telephone Encounter (Signed)
Patient called requesting prescription refill of Folic Acid to be sent to [facility] at [address] in [place]. Patient was informed that her refill won't be sent in until the end of the week since we don't have a provider in the office until Wednesday, 03/17/20.

## 2020-03-15 NOTE — Telephone Encounter (Signed)
Last Refill:  12/16/2019 Last office visit:  11/26/2019 Next office visit:  04/29/2019 Last labs: 11/26/2019 normal Labs due now

## 2020-04-14 NOTE — Progress Notes (Deleted)
Office Visit Note  Patient: Cathy Casey             Date of Birth: 29-Mar-1960           MRN: [id number]             PCP: Abner Greenspan, MD Referring: Abner Greenspan, MD Visit Date: 04/28/2020 Occupation: @GUAROCC @  Subjective:  No chief complaint on file.   History of Present Illness: Cathy Casey is a 60 y.o. female ***   Activities of Daily Living:  Patient reports morning stiffness for *** {minute/hour:19697}.   Patient {ACTIONS;DENIES/REPORTS:21021675::"Denies"} nocturnal pain.  Difficulty dressing/grooming: {ACTIONS;DENIES/REPORTS:21021675::"Denies"} Difficulty climbing stairs: {ACTIONS;DENIES/REPORTS:21021675::"Denies"} Difficulty getting out of chair: {ACTIONS;DENIES/REPORTS:21021675::"Denies"} Difficulty using hands for taps, buttons, cutlery, and/or writing: {ACTIONS;DENIES/REPORTS:21021675::"Denies"}  No Rheumatology ROS completed.   PMFS History:  Patient Active Problem List   Diagnosis Date Noted  . Rheumatoid arthritis with rheumatoid factor of multiple sites without organ or systems involvement (HCC) 04/03/2016  . High risk medications (not anticoagulants) long-term use 04/03/2016  . Primary osteoarthritis of both knees 03/29/2016  . Chronic pain of both knees 03/29/2016    Past Medical History:  Diagnosis Date  . Overactive bladder   . Rheumatoid arthritis (HCC)     Family History  Problem Relation Age of Onset  . Diabetes Mother   . Rheum arthritis Sister   . Diabetes Sister   . Healthy Son   . Healthy Son   . Healthy Son    Past Surgical History:  Procedure Laterality Date  . ABDOMINAL HYSTERECTOMY    . CHOLECYSTECTOMY     Social History   Social History Narrative  . Not on file   Immunization History  Administered Date(s) Administered  . Moderna Sars-Covid-2 Vaccination 05/21/2019, 06/18/2019     Objective: Vital Signs: There were no vitals taken for this visit.   Physical Exam   Musculoskeletal Exam: ***  CDAI Exam: CDAI Score:  - Patient Global: -; Provider Global: - Swollen: -; Tender: - Joint Exam 04/28/2020   No joint exam has been documented for this visit   There is currently no information documented on the homunculus. Go to the Rheumatology activity and complete the homunculus joint exam.  Investigation: No additional findings.  Imaging: No results found.  Recent Labs: Lab Results  Component Value Date   WBC 5.5 07/21/2019   HGB 12.0 07/21/2019   PLT 266 07/21/2019   NA 139 11/26/2019   K 5.0 11/26/2019   CL 106 11/26/2019   CO2 27 11/26/2019   GLUCOSE 83 11/26/2019   BUN 15 11/26/2019   CREATININE 0.87 11/26/2019   BILITOT 0.3 11/26/2019   ALKPHOS 96 09/01/2016   AST 25 11/26/2019   ALT 24 11/26/2019   PROT 6.5 11/26/2019   ALBUMIN 4.1 09/01/2016   CALCIUM 9.8 11/26/2019   GFRAA 85 11/26/2019    Speciality Comments: No specialty comments available.  Procedures:  No procedures performed Allergies: Patient has no known allergies.   Assessment / Plan:     Visit Diagnoses: No diagnosis found.  Orders: No orders of the defined types were placed in this encounter.  No orders of the defined types were placed in this encounter.   Face-to-face time spent with patient was *** minutes. Greater than 50% of time was spent in counseling and coordination of care.  Follow-Up Instructions: No follow-ups on file.   01/26/2020, CMA  Note - This record has been created using Ellen Henri.  Chart creation errors have been sought,  but may not always  have been located. Such creation errors do not reflect on  the standard of medical care.

## 2020-04-27 ENCOUNTER — Telehealth: Payer: Self-pay

## 2020-04-27 NOTE — Telephone Encounter (Signed)
Patient left a voicemail stating she just went for a COVID test.  Patient states she has a question about her Methotrexate and requested a return call.

## 2020-04-28 ENCOUNTER — Ambulatory Visit: Payer: BC Managed Care – PPO | Admitting: Physician Assistant

## 2020-04-28 DIAGNOSIS — N3281 Overactive bladder: Secondary | ICD-10-CM

## 2020-04-28 DIAGNOSIS — Z79899 Other long term (current) drug therapy: Secondary | ICD-10-CM

## 2020-04-28 DIAGNOSIS — M0579 Rheumatoid arthritis with rheumatoid factor of multiple sites without organ or systems involvement: Secondary | ICD-10-CM

## 2020-04-28 DIAGNOSIS — M5136 Other intervertebral disc degeneration, lumbar region: Secondary | ICD-10-CM

## 2020-04-28 DIAGNOSIS — M170 Bilateral primary osteoarthritis of knee: Secondary | ICD-10-CM

## 2020-04-28 DIAGNOSIS — M722 Plantar fascial fibromatosis: Secondary | ICD-10-CM

## 2020-04-28 DIAGNOSIS — M19071 Primary osteoarthritis, right ankle and foot: Secondary | ICD-10-CM

## 2020-04-28 NOTE — Telephone Encounter (Signed)
Spoke with patient and she states she needs to cancel her appointment for today. Appointment has been cancelled. Patient states she is due to take her MTX on Friday. Patient advised to hold her MTX. Patient advised if she test positive for Covid to hold her MTX for 2-3 weeks after symptoms resolve. Patient advised to call our office if she test positive to contact the office so we can place a referral for the monoclonal antibody infusion. Patient expressed understanding.

## 2020-05-01 ENCOUNTER — Other Ambulatory Visit: Payer: Self-pay | Admitting: Rheumatology

## 2020-05-03 ENCOUNTER — Telehealth: Payer: Self-pay

## 2020-05-03 NOTE — Telephone Encounter (Signed)
Patient called stating her COVID test was negative.  Patient states she was diagnosed with Strept throat.  Patient had labwork with Dr. Abner Greenspan last month and will have them fax her results to the office.

## 2020-05-03 NOTE — Telephone Encounter (Signed)
Patient advised to hold MTX while she is on the antibiotics and is well. Patient advised we have not received labs yet. She will contact the PCP to re-fax.

## 2020-05-06 ENCOUNTER — Telehealth: Payer: Self-pay | Admitting: *Deleted

## 2020-05-06 NOTE — Telephone Encounter (Signed)
Labs received from:Dr. Abner Greenspan, [facility]  Drawn on:03/15/2020 Reviewed by:Sherron Ales, PA-C  Labs drawn:CBC w/Diff, CMP  Results: normal

## 2020-05-14 ENCOUNTER — Telehealth: Payer: Self-pay

## 2020-05-14 NOTE — Telephone Encounter (Signed)
Patient called requesting prescription refill of Methotrexate to be sent to [facility] at [address].    Patient states she finished her Penicillin on Sunday, 05/09/20 and is asking if she can take her Methotrexate today.

## 2020-05-14 NOTE — Telephone Encounter (Signed)
Please advise 

## 2020-05-14 NOTE — Telephone Encounter (Signed)
Attempted to contact the patient and left message for patient to call the office.  

## 2020-05-14 NOTE — Telephone Encounter (Signed)
Okay to restart Methotrexate if she has completed the antibiotics and her symptom have completely resolved.

## 2020-05-17 ENCOUNTER — Telehealth: Payer: Self-pay

## 2020-05-17 MED ORDER — METHOTREXATE 2.5 MG PO TABS: ORAL_TABLET | ORAL | 0 refills | Status: DC

## 2020-05-17 NOTE — Telephone Encounter (Signed)
Last Visit: 11/26/2019 Next Visit: 06/10/2020 Labs: 03/05/2020, normal  Current Dose per office note 11/26/2019, Methotrexate 3 tablets by mouth once weekly  DX: Rheumatoid arthritis with rheumatoid factor of multiple sites without organ or systems involvement   Last Fill: 02/02/2020  Okay to refill MTX?

## 2020-05-17 NOTE — Telephone Encounter (Signed)
LMOM for patient to call office to discuss MTX

## 2020-05-17 NOTE — Telephone Encounter (Signed)
Patient called stating she was returning Sharon's call regarding her medication.  Call transferred to [facility].

## 2020-05-28 NOTE — Progress Notes (Addendum)
Office Visit Note  Patient: Cathy Casey             Date of Birth: 10-14-1960           MRN: [id number]             PCP: Abner Greenspan, MD Referring: Abner Greenspan, MD Visit Date: 06/10/2020 Occupation: @GUAROCC @  Subjective:  Pain of both knees  History of Present Illness: Cathy Casey is a 60 y.o. female with history of seropositive rheumatoid arthritis, osteoarthritis, and DDD.  She is taking methotrexate 3 tablets by mouth once weekly and folic acid 1 mg by mouth daily. She takes meloxicam 15 mg 1 tablet by mouth daily as needed for pain relief.  She has been taking meloxicam very sparingly for pain relief.  She denies any recent rheumatoid arthritis flares.  She is not currently experiencing any joint swelling.  She states that she has started to have increased pain in both knee joints over the past several weeks.  She has increased difficulty rising from a seated position as well as climbing steps.  She had Visco gel injections performed in May 2021 which provided significant relief for 9 to 10 months.  She would like to reapply for Visco gel injections for both knees. Patient reports that she has been experiencing increased fatigue recently.  She states that she has not been sleeping well and typically only gets about 6 hours per night.  She tried taking melatonin at bedtime but developed increased headaches and discontinued.     Activities of Daily Living:  Patient reports morning stiffness for 1-2 minutes.   Patient Denies nocturnal pain.  Difficulty dressing/grooming: Denies Difficulty climbing stairs: Reports Difficulty getting out of chair: Denies Difficulty using hands for taps, buttons, cutlery, and/or writing: Reports  Review of Systems  Constitutional: Positive for fatigue.  HENT: Negative for mouth sores, mouth dryness and nose dryness.   Eyes: Negative for pain, itching, visual disturbance and dryness.  Respiratory: Negative for cough, hemoptysis, shortness of breath  and difficulty breathing.   Cardiovascular: Negative for chest pain, palpitations and swelling in legs/feet.  Gastrointestinal: Negative for abdominal pain, blood in stool, constipation and diarrhea.  Endocrine: Positive for increased urination.  Genitourinary: Negative for painful urination.  Musculoskeletal: Positive for arthralgias, joint pain and morning stiffness. Negative for joint swelling, myalgias, muscle weakness, muscle tenderness and myalgias.  Skin: Negative for color change, rash and redness.  Allergic/Immunologic: Negative for susceptible to infections.  Neurological: Positive for weakness. Negative for dizziness, numbness, headaches and memory loss.  Hematological: Negative for swollen glands.  Psychiatric/Behavioral: Positive for sleep disturbance. Negative for confusion.    PMFS History:  Patient Active Problem List   Diagnosis Date Noted  . Rheumatoid arthritis with rheumatoid factor of multiple sites without organ or systems involvement (HCC) 04/03/2016  . High risk medications (not anticoagulants) long-term use 04/03/2016  . Primary osteoarthritis of both knees 03/29/2016  . Chronic pain of both knees 03/29/2016    Past Medical History:  Diagnosis Date  . Overactive bladder   . Rheumatoid arthritis (HCC)     Family History  Problem Relation Age of Onset  . Diabetes Mother   . Rheum arthritis Sister   . Diabetes Sister   . Healthy Son   . Healthy Son   . Healthy Son    Past Surgical History:  Procedure Laterality Date  . ABDOMINAL HYSTERECTOMY    . CHOLECYSTECTOMY     Social History   Social History Narrative  .  Not on file   Immunization History  Administered Date(s) Administered  . Moderna Sars-Covid-2 Vaccination 05/21/2019, 06/18/2019, 12/12/2019     Objective: Vital Signs: BP 118/81 (BP Location: Left Arm, Patient Position: Sitting, Cuff Size: Normal)   Pulse 64   Wt 141 lb 6.4 oz (64.1 kg)   BMI 23.53 kg/m    Physical Exam Vitals and  nursing note reviewed.  Constitutional:      Appearance: She is well-developed.  HENT:     Head: Normocephalic and atraumatic.  Eyes:     Conjunctiva/sclera: Conjunctivae normal.  Pulmonary:     Effort: Pulmonary effort is normal.  Abdominal:     Palpations: Abdomen is soft.  Musculoskeletal:     Cervical back: Normal range of motion.  Skin:    General: Skin is warm and dry.     Capillary Refill: Capillary refill takes less than 2 seconds.  Neurological:     Mental Status: She is alert and oriented to person, place, and time.  Psychiatric:        Behavior: Behavior normal.      Musculoskeletal Exam: C-spine, thoracic spine, and lumbar spine good ROM.  Shoulder joints, elbow joints, wrist joints, MCPs, PIPs, and DIPs good ROM with no synovitis.  Complete fist formation bilaterally.  Hip joints, knee joints, ankle joints good ROM with no discomfort.  No warmth or effusion of knee joints.  No tenderness or swelling of ankle joints.  CDAI Exam: CDAI Score: 0.8  Patient Global: 4 mm; Provider Global: 4 mm Swollen: 0 ; Tender: 0  Joint Exam 06/10/2020   No joint exam has been documented for this visit   There is currently no information documented on the homunculus. Go to the Rheumatology activity and complete the homunculus joint exam.  Investigation: No additional findings.  Imaging: No results found.  Recent Labs: Lab Results  Component Value Date   WBC 5.5 07/21/2019   HGB 12.0 07/21/2019   PLT 266 07/21/2019   NA 139 11/26/2019   K 5.0 11/26/2019   CL 106 11/26/2019   CO2 27 11/26/2019   GLUCOSE 83 11/26/2019   BUN 15 11/26/2019   CREATININE 0.87 11/26/2019   BILITOT 0.3 11/26/2019   ALKPHOS 96 09/01/2016   AST 25 11/26/2019   ALT 24 11/26/2019   PROT 6.5 11/26/2019   ALBUMIN 4.1 09/01/2016   CALCIUM 9.8 11/26/2019   GFRAA 85 11/26/2019    Speciality Comments: No specialty comments available.  Procedures:  No procedures performed Allergies: Patient  has no known allergies.   Assessment / Plan:     Visit Diagnoses: Rheumatoid arthritis with rheumatoid factor of multiple sites without organ or systems involvement (HCC) -She has no joint tenderness or synovitis on exam.  She has not had any recent rheumatoid arthritis flares.  She is clinically doing well taking methotrexate 3 tablets by mouth once weekly and folic acid 1 mg by mouth daily.  She presents today with increased discomfort in both knees due to underlying osteoarthritis.  She has good range of motion of both knee joints on exam with no warmth or effusion.  Updated x-rays of both knees were obtained today.  We will reapply for Visco gel injections for both knees.  We also updated x-rays of both hands and feet to assess for radiographic progression.  She will continue on the current dose of methotrexate.  She was advised to notify us if she develops increased joint pain or joint swelling.  She will follow-up in  the office in 5 months.  Plan: XR Hand 2 View Left, XR Hand 2 View Right, XR Foot 2 Views Right, XR Foot 2 Views Left  High risk medication use - Methotrexate 3 tablets by mouth once weekly and folic acid 1 mg po daily.  CMP was within normal limits on 05/20/2020.  We will try to obtain most recent CBC results.  Her next lab work will be due in June and every 3 months to monitor for drug toxicity.  Standing orders for CBC and CMP are in place. She is aware that she has to hold methotrexate if she develops signs or symptoms of an infection and to resume once the infection has completely cleared. She has received 3 moderna covid-19 vaccine doses.   Primary osteoarthritis of both knees - s/p euflexxa bilateral knees 07/2019.  She noticed significant improvement in her knee joint pain after undergoing Visco gel injections in May 2021.  Her discomfort was alleviated for about 9 to 10 months but has gradually started to return.  She has noticed increased difficulty rising from a seated position as  well as climbing steps.  X-rays of both knees were obtained today.  We will apply for Visco gel injections for both knees.- Plan: XR KNEE 3 VIEW RIGHT, XR KNEE 3 VIEW LEFT  Primary osteoarthritis of both feet: She is not experiencing any discomfort in her feet at this time.  She has good range of motion of both ankle joints with no tenderness or inflammation.  Updated x-rays of both feet were obtained today.  DDD (degenerative disc disease), lumbar: She is not experiencing any discomfort in her feet at this time.  Other fatigue: She has been experiencing increased fatigue over the past several months.  We discussed the importance of regular exercise and good sleep hygiene.  Plantar fasciitis - Resolved.  Overactive bladder  Orders: Orders Placed This Encounter  Procedures  . XR Hand 2 View Left  . XR Hand 2 View Right  . XR KNEE 3 VIEW RIGHT  . XR KNEE 3 VIEW LEFT  . XR Foot 2 Views Right  . XR Foot 2 Views Left   No orders of the defined types were placed in this encounter.    Follow-Up Instructions: Return in about 5 months (around 11/10/2020) for Rheumatoid arthritis, Osteoarthritis.   Gearldine Bienenstock, PA-C  Note - This record has been created using Dragon software.  Chart creation errors have been sought, but may not always  have been located. Such creation errors do not reflect on  the standard of medical care.

## 2020-06-10 ENCOUNTER — Ambulatory Visit: Payer: BC Managed Care – PPO | Admitting: Physician Assistant

## 2020-06-10 ENCOUNTER — Ambulatory Visit: Payer: Self-pay

## 2020-06-10 ENCOUNTER — Telehealth: Payer: Self-pay

## 2020-06-10 ENCOUNTER — Encounter: Payer: Self-pay | Admitting: Physician Assistant

## 2020-06-10 ENCOUNTER — Other Ambulatory Visit: Payer: Self-pay

## 2020-06-10 VITALS — BP 118/81 | HR 64 | Wt 141.4 lb

## 2020-06-10 DIAGNOSIS — M79672 Pain in left foot: Secondary | ICD-10-CM | POA: Diagnosis not present

## 2020-06-10 DIAGNOSIS — M19071 Primary osteoarthritis, right ankle and foot: Secondary | ICD-10-CM | POA: Diagnosis not present

## 2020-06-10 DIAGNOSIS — M79642 Pain in left hand: Secondary | ICD-10-CM

## 2020-06-10 DIAGNOSIS — Z79899 Other long term (current) drug therapy: Secondary | ICD-10-CM

## 2020-06-10 DIAGNOSIS — M0579 Rheumatoid arthritis with rheumatoid factor of multiple sites without organ or systems involvement: Secondary | ICD-10-CM

## 2020-06-10 DIAGNOSIS — M1711 Unilateral primary osteoarthritis, right knee: Secondary | ICD-10-CM

## 2020-06-10 DIAGNOSIS — M170 Bilateral primary osteoarthritis of knee: Secondary | ICD-10-CM

## 2020-06-10 DIAGNOSIS — M1712 Unilateral primary osteoarthritis, left knee: Secondary | ICD-10-CM

## 2020-06-10 DIAGNOSIS — M79671 Pain in right foot: Secondary | ICD-10-CM | POA: Diagnosis not present

## 2020-06-10 DIAGNOSIS — M722 Plantar fascial fibromatosis: Secondary | ICD-10-CM

## 2020-06-10 DIAGNOSIS — N3281 Overactive bladder: Secondary | ICD-10-CM

## 2020-06-10 DIAGNOSIS — R5383 Other fatigue: Secondary | ICD-10-CM

## 2020-06-10 DIAGNOSIS — M19072 Primary osteoarthritis, left ankle and foot: Secondary | ICD-10-CM

## 2020-06-10 DIAGNOSIS — M5136 Other intervertebral disc degeneration, lumbar region: Secondary | ICD-10-CM

## 2020-06-10 DIAGNOSIS — M79641 Pain in right hand: Secondary | ICD-10-CM | POA: Diagnosis not present

## 2020-06-10 NOTE — Patient Instructions (Signed)
Standing Labs We placed an order today for your standing lab work.   Please have your standing labs drawn in June and every 3 months   If possible, please have your labs drawn 2 weeks prior to your appointment so that the provider can discuss your results at your appointment.  We have open lab daily Monday through Thursday from 1:30-4:30 PM and Friday from 1:30-4:00 PM at the office of Dr. Shaili Deveshwar, [facility].   Please be advised, all patients with office appointments requiring lab work will take precedents over walk-in lab work.  If possible, please come for your lab work on Monday and Friday afternoons, as you may experience shorter wait times. The office is located at [address], [address], [place], [place] [address] No appointment is necessary.   Labs are drawn by [facility]. Please bring your co-pay at the time of your lab draw.  You may receive a bill from [facility] for your lab work.  If you wish to have your labs drawn at another location, please call the office 24 hours in advance to send orders.  If you have any questions regarding directions or hours of operation,  please call [phone].   As a reminder, please drink plenty of water prior to coming for your lab work. Thanks!   

## 2020-06-10 NOTE — Telephone Encounter (Signed)
Please apply for bilateral knee visco, per Taylor Dale, PA-C. Thanks!  

## 2020-06-10 NOTE — Progress Notes (Signed)
Please call the patient to review x-ray results.  X-rays of both hands and feet were consistent with osteoarthritis.  No erosive changes noted.  No x-rays for comparison were available.  X-rays of both knees were consistent with moderate osteoarthritis and moderate chondromalacia patella.   Please apply for visco gel injections for both knees.

## 2020-06-17 NOTE — Telephone Encounter (Signed)
Submitted for VOB 06/17/2020. 

## 2020-06-29 NOTE — Telephone Encounter (Addendum)
[employer] [place] has denied continued coverage for Euflexxa stating patient does not meet medical guidelines for treatment.  Medication is approved when #1. The Member uses fewer pain medications (such as Ibuprofen, Naproxen, Acetaminophen, etc.), or a lower dose since the last knee injection. #2. Also, when the member has less pain and has improved in activities of daily living since last injection.  Medical records are required to show this. Please advise of next step?

## 2020-06-29 NOTE — Telephone Encounter (Signed)
Last office note: She noticed significant improvement in her knee joint pain after undergoing Visco gel injections in May 2021.  Her discomfort was alleviated for about 9 to 10 months but has gradually started to return.  I called patient, she is taking Meloxicam, which is in her med list. No change in dosage since last injection. She is not using any of the other medications for pain.  Please advise.

## 2020-07-13 NOTE — Telephone Encounter (Signed)
Please call patient to schedule Visco knee injections.   Authorized for [employer] series Bilateral Knees. Buy and Bill. Deductible does not apply. PA for Euflexxa thru [employer]. Authorization # [id number]  effective 06/25/2020 - 12/25/2020 Insurance to cover 100% of allowable cost, with a $80.00 copay each visit.

## 2020-07-15 NOTE — Telephone Encounter (Signed)
I LMOM for patient to call for benefit information, and to schedule knee injections.

## 2020-08-05 ENCOUNTER — Telehealth: Payer: Self-pay | Admitting: Pharmacist

## 2020-08-05 NOTE — Telephone Encounter (Signed)
Received call from PCP office who has tele-visit with patient. She tested positive for COVID19 infection today and is having runny nose (mild symptoms). They were requesting advise on pursuing infusion vs oral treatment options. Advised that we do not prefer one over another since both are indicate for mild symptoms. Advised they communicate to patient that she should hold MTX during the course of the infection and at least 1 week after symptoms resolve.  Chesley Mires, PharmD, MPH Clinical Pharmacist (Rheumatology and Pulmonology)

## 2020-08-23 ENCOUNTER — Other Ambulatory Visit: Payer: Self-pay | Admitting: Physician Assistant

## 2020-08-23 NOTE — Telephone Encounter (Signed)
Last Visit: 06/10/2020 Next Visit: 10/21/2020 Labs:  CMP was within normal limits on 05/20/2020.    Current Dose per office note 06/10/2020: Methotrexate 3 tablets by mouth once weekly  Dx: Rheumatoid arthritis with rheumatoid factor of multiple sites without organ or systems involvement   Noted next appointment to update labs.   Okay to refill MTX?

## 2020-08-30 ENCOUNTER — Ambulatory Visit: Payer: BC Managed Care – PPO | Admitting: Physician Assistant

## 2020-08-30 ENCOUNTER — Other Ambulatory Visit: Payer: Self-pay

## 2020-08-30 DIAGNOSIS — M170 Bilateral primary osteoarthritis of knee: Secondary | ICD-10-CM

## 2020-08-30 MED ORDER — LIDOCAINE HCL 1 % IJ SOLN
1.5000 mL | INTRAMUSCULAR | Status: AC | PRN
  Administered 2020-08-30: 1.5 mL

## 2020-08-30 MED ORDER — SODIUM HYALURONATE (VISCOSUP) 20 MG/2ML IX SOSY
20.0000 mg | PREFILLED_SYRINGE | INTRA_ARTICULAR | Status: AC | PRN
  Administered 2020-08-30: 20 mg via INTRA_ARTICULAR

## 2020-08-30 NOTE — Progress Notes (Signed)
   Procedure Note  Patient: Cathy Casey             Date of Birth: 1960-08-09           MRN: [id number]             Visit Date: 08/30/2020  Procedures: Visit Diagnoses:  1. Primary osteoarthritis of both knees      Euflexxa #1 bilateral knees, B/B Large Joint Inj: bilateral knee on 08/30/2020 8:37 AM Indications: pain Details: 27 G 1.5 in medial  Arthrogram: No  Medications (Right): 20 mg Sodium Hyaluronate 20 MG/2ML; 1.5 mL lidocaine 1 % Aspirate (Right): 0 mL Medications (Left): 20 mg Sodium Hyaluronate 20 MG/2ML; 1.5 mL lidocaine 1 % Aspirate (Left): 0 mL Outcome: tolerated well, no immediate complications   Patient tolerated the procedure well.  Aftercare was discussed.  Sherron Ales, PA-C

## 2020-09-06 ENCOUNTER — Other Ambulatory Visit: Payer: Self-pay

## 2020-09-06 ENCOUNTER — Ambulatory Visit: Payer: BC Managed Care – PPO | Admitting: Physician Assistant

## 2020-09-06 DIAGNOSIS — M170 Bilateral primary osteoarthritis of knee: Secondary | ICD-10-CM

## 2020-09-06 DIAGNOSIS — Z79899 Other long term (current) drug therapy: Secondary | ICD-10-CM

## 2020-09-06 MED ORDER — SODIUM HYALURONATE (VISCOSUP) 20 MG/2ML IX SOSY
20.0000 mg | PREFILLED_SYRINGE | INTRA_ARTICULAR | Status: AC | PRN
  Administered 2020-09-06: 20 mg via INTRA_ARTICULAR

## 2020-09-06 MED ORDER — LIDOCAINE HCL 1 % IJ SOLN
1.5000 mL | INTRAMUSCULAR | Status: AC | PRN
  Administered 2020-09-06: 1.5 mL via INTRA_ARTICULAR

## 2020-09-06 NOTE — Progress Notes (Signed)
   Procedure Note  Patient: Cathy Casey             Date of Birth: 11-10-1960           MRN: [id number]             Visit Date: 09/06/2020  Procedures: Visit Diagnoses:  1. Primary osteoarthritis of both knees    Euflexxa #2 bilateral knees, B/B Large Joint Inj: bilateral knee on 09/06/2020 8:46 AM Indications: pain Details: 27 G 1.5 in needle, medial approach  Arthrogram: No  Medications (Right): 1.5 mL lidocaine 1 %; 20 mg Sodium Hyaluronate 20 MG/2ML Aspirate (Right): 0 mL Medications (Left): 1.5 mL lidocaine 1 %; 20 mg Sodium Hyaluronate 20 MG/2ML Aspirate (Left): 0 mL Outcome: tolerated well, no immediate complications Procedure, treatment alternatives, risks and benefits explained, specific risks discussed. Consent was given by the patient. Immediately prior to procedure a time out was called to verify the correct patient, procedure, equipment, support staff and site/side marked as required. Patient was prepped and draped in the usual sterile fashion.    Patient tolerated the procedure well.  Aftercare was discussed.  Sherron Ales, PA-C

## 2020-09-06 NOTE — Telephone Encounter (Signed)
Meloxicam refill pended that patient requested today at her appointment.   Next Visit: 10/21/2020  Last Visit: 06/10/2020  DX: Rheumatoid arthritis with rheumatoid factor of multiple sites without organ or systems involvement  Current Dose per office note on 06/10/2020: meloxicam 15 mg 1 tablet by mouth daily as needed for pain relief.   Labs: CMP was within normal limits on 05/20/2020. Labs drawn today, pending results.   Okay to refill meloxicam?

## 2020-09-07 LAB — COMPLETE METABOLIC PANEL WITH GFR
AG Ratio: 1.7 (calc) (ref 1.0–2.5)
ALT: 22 U/L (ref 6–29)
AST: 24 U/L (ref 10–35)
Albumin: 4 g/dL (ref 3.6–5.1)
Alkaline phosphatase (APISO): 65 U/L (ref 37–153)
BUN: 13 mg/dL (ref 7–25)
CO2: 30 mmol/L (ref 20–32)
Calcium: 9.6 mg/dL (ref 8.6–10.4)
Chloride: 107 mmol/L (ref 98–110)
Creat: 0.78 mg/dL (ref 0.50–0.99)
GFR, Est African American: 96 mL/min/{1.73_m2} (ref >=60)
GFR, Est Non African American: 83 mL/min/{1.73_m2} (ref >=60)
Globulin: 2.4 g/dL (calc) (ref 1.9–3.7)
Glucose, Bld: 59 mg/dL — ABNORMAL LOW (ref 65–99)
Potassium: 4.3 mmol/L (ref 3.5–5.3)
Sodium: 143 mmol/L (ref 135–146)
Total Bilirubin: 0.5 mg/dL (ref 0.2–1.2)
Total Protein: 6.4 g/dL (ref 6.1–8.1)

## 2020-09-07 LAB — CBC WITH DIFFERENTIAL/PLATELET
Absolute Monocytes: 794 cells/uL (ref 200–950)
Basophils Absolute: 58 cells/uL (ref 0–200)
Basophils Relative: 0.9 %
Eosinophils Absolute: 160 cells/uL (ref 15–500)
Eosinophils Relative: 2.5 %
HCT: 36.8 % (ref 35.0–45.0)
Hemoglobin: 12.3 g/dL (ref 11.7–15.5)
Lymphs Abs: 2637 cells/uL (ref 850–3900)
MCH: 30.8 pg (ref 27.0–33.0)
MCHC: 33.4 g/dL (ref 32.0–36.0)
MCV: 92 fL (ref 80.0–100.0)
MPV: 10.5 fL (ref 7.5–12.5)
Monocytes Relative: 12.4 %
Neutro Abs: 2752 cells/uL (ref 1500–7800)
Neutrophils Relative %: 43 %
Platelets: 275 10*3/uL (ref 140–400)
RBC: 4 10*6/uL (ref 3.80–5.10)
RDW: 12.9 % (ref 11.0–15.0)
Total Lymphocyte: 41.2 %
WBC: 6.4 10*3/uL (ref 3.8–10.8)

## 2020-09-07 MED ORDER — MELOXICAM 15 MG PO TABS: 15.0000 mg | ORAL_TABLET | Freq: Every day | ORAL | 1 refills | Status: DC | PRN

## 2020-09-07 NOTE — Progress Notes (Signed)
CBC and CMP WNL

## 2020-09-13 ENCOUNTER — Ambulatory Visit: Payer: BC Managed Care – PPO | Admitting: Physician Assistant

## 2020-09-15 ENCOUNTER — Ambulatory Visit (INDEPENDENT_AMBULATORY_CARE_PROVIDER_SITE_OTHER): Payer: BC Managed Care – PPO | Admitting: Physician Assistant

## 2020-09-15 ENCOUNTER — Other Ambulatory Visit: Payer: Self-pay

## 2020-09-15 DIAGNOSIS — M170 Bilateral primary osteoarthritis of knee: Secondary | ICD-10-CM

## 2020-09-15 MED ORDER — LIDOCAINE HCL 1 % IJ SOLN
1.5000 mL | INTRAMUSCULAR | Status: AC | PRN
  Administered 2020-09-15: 1.5 mL

## 2020-09-15 MED ORDER — SODIUM HYALURONATE (VISCOSUP) 20 MG/2ML IX SOSY
20.0000 mg | PREFILLED_SYRINGE | INTRA_ARTICULAR | Status: AC | PRN
  Administered 2020-09-15: 20 mg via INTRA_ARTICULAR

## 2020-09-15 NOTE — Progress Notes (Signed)
   Procedure Note  Patient: Cathy Casey             Date of Birth: 1960-09-07           MRN: [id number]             Visit Date: 09/15/2020  Procedures: Visit Diagnoses:  1. Primary osteoarthritis of both knees    Euflexxa #3 Bilateral knee joint injections  Large Joint Inj: bilateral knee on 09/15/2020 7:59 AM Indications: pain Details: 27 G 1.5 in needle, medial approach  Arthrogram: No  Medications (Right): 1.5 mL lidocaine 1 %; 20 mg Sodium Hyaluronate 20 MG/2ML Aspirate (Right): 0 mL Medications (Left): 1.5 mL lidocaine 1 %; 20 mg Sodium Hyaluronate 20 MG/2ML Aspirate (Left): 0 mL Outcome: tolerated well, no immediate complications Procedure, treatment alternatives, risks and benefits explained, specific risks discussed. Consent was given by the patient. Immediately prior to procedure a time out was called to verify the correct patient, procedure, equipment, support staff and site/side marked as required. Patient was prepped and draped in the usual sterile fashion.    Patient tolerated the procedure well.  Aftercare was discussed.  Sherron Ales, PA-C

## 2020-09-29 ENCOUNTER — Telehealth: Payer: Self-pay | Admitting: Rheumatology

## 2020-09-29 NOTE — Telephone Encounter (Signed)
There are no known drug interactions between MTX and meclizine.

## 2020-09-29 NOTE — Telephone Encounter (Signed)
Patient advised there are no drug interactions between MTX and Meclizine. Patient expressed understanding.

## 2020-09-29 NOTE — Telephone Encounter (Signed)
Patient calling to see if she can take Meclizine with her MTX?  Medication for dizziness. Please call to advise.

## 2020-10-07 NOTE — Progress Notes (Signed)
Office Visit Note  Patient: Cathy Casey             Date of Birth: October 04, 1960           MRN: [id number]             PCP: Abner Greenspan, MD Referring: Abner Greenspan, MD Visit Date: 10/21/2020 Occupation: @GUAROCC @  Subjective:  Medication management.   History of Present Illness: Cathy Casey is a 60 y.o. female with history of rheumatoid arthritis and osteoarthritis.  She states she has been doing well on methotrexate 3 tablets/week.  She has some stiffness in her hands after driving or doing activities.  She denies history of joint swelling.  Activities of Daily Living:  Patient reports morning stiffness for 5-10 minutes.   Patient Reports nocturnal pain.  Difficulty dressing/grooming: Denies Difficulty climbing stairs: Reports Difficulty getting out of chair: Reports Difficulty using hands for taps, buttons, cutlery, and/or writing: Denies  Review of Systems  Constitutional:  Positive for fatigue.  HENT:  Negative for mouth sores, mouth dryness and nose dryness.   Eyes:  Negative for pain, itching and dryness.  Respiratory:  Negative for shortness of breath and difficulty breathing.   Cardiovascular:  Negative for chest pain and palpitations.  Gastrointestinal:  Negative for blood in stool, constipation and diarrhea.  Endocrine: Negative for increased urination.  Genitourinary:  Negative for difficulty urinating.  Musculoskeletal:  Positive for joint pain, joint pain and morning stiffness. Negative for joint swelling, myalgias, muscle tenderness and myalgias.  Skin:  Negative for color change, rash and redness.  Allergic/Immunologic: Negative for susceptible to infections.  Neurological:  Positive for weakness. Negative for dizziness, numbness, headaches and memory loss.  Hematological:  Negative for bruising/bleeding tendency.  Psychiatric/Behavioral:  Negative for confusion.    PMFS History:  Patient Active Problem List   Diagnosis Date Noted   Rheumatoid arthritis with  rheumatoid factor of multiple sites without organ or systems involvement (HCC) 04/03/2016   High risk medications (not anticoagulants) long-term use 04/03/2016   Primary osteoarthritis of both knees 03/29/2016   Chronic pain of both knees 03/29/2016    Past Medical History:  Diagnosis Date   Overactive bladder    Rheumatoid arthritis (HCC)     Family History  Problem Relation Age of Onset   Diabetes Mother    Rheum arthritis Sister    Diabetes Sister    Healthy Son    Healthy Son    Healthy Son    Past Surgical History:  Procedure Laterality Date   ABDOMINAL HYSTERECTOMY     CHOLECYSTECTOMY     Social History   Social History Narrative   Not on file   Immunization History  Administered Date(s) Administered   Moderna Sars-Covid-2 Vaccination 05/21/2019, 06/18/2019, 12/12/2019, 05/27/2020     Objective: Vital Signs: BP 110/75 (BP Location: Left Arm, Patient Position: Sitting, Cuff Size: Normal)   Pulse 61   Ht 5\' 4"  (1.626 m)   Wt 142 lb 6.4 oz (64.6 kg)   BMI 24.44 kg/m    Physical Exam Vitals and nursing note reviewed.  Constitutional:      Appearance: She is well-developed.  HENT:     Head: Normocephalic and atraumatic.  Eyes:     Conjunctiva/sclera: Conjunctivae normal.  Cardiovascular:     Rate and Rhythm: Normal rate and regular rhythm.     Heart sounds: Normal heart sounds.  Pulmonary:     Effort: Pulmonary effort is normal.     Breath sounds: Normal  breath sounds.  Abdominal:     General: Bowel sounds are normal.     Palpations: Abdomen is soft.  Musculoskeletal:     Cervical back: Normal range of motion.  Lymphadenopathy:     Cervical: No cervical adenopathy.  Skin:    General: Skin is warm and dry.     Capillary Refill: Capillary refill takes less than 2 seconds.  Neurological:     Mental Status: She is alert and oriented to person, place, and time.  Psychiatric:        Behavior: Behavior normal.     Musculoskeletal Exam: C-spine was in  good range of motion.  Shoulder joints, elbow joints, wrist joints with good range of motion.  She had bilateral PIP and DIP thickening with no synovitis.  Hip joints, knee joints, ankles, MTPs and PIPs with good range of motion with no synovitis.  CDAI Exam: CDAI Score: 0.4  Patient Global: 2 mm; Provider Global: 2 mm Swollen: 0 ; Tender: 0  Joint Exam 10/21/2020   No joint exam has been documented for this visit   There is currently no information documented on the homunculus. Go to the Rheumatology activity and complete the homunculus joint exam.  Investigation: No additional findings.  Imaging: No results found.  Recent Labs: Lab Results  Component Value Date   WBC 6.4 09/06/2020   HGB 12.3 09/06/2020   PLT 275 09/06/2020   NA 143 09/06/2020   K 4.3 09/06/2020   CL 107 09/06/2020   CO2 30 09/06/2020   GLUCOSE 59 (L) 09/06/2020   BUN 13 09/06/2020   CREATININE 0.78 09/06/2020   BILITOT 0.5 09/06/2020   ALKPHOS 96 09/01/2016   AST 24 09/06/2020   ALT 22 09/06/2020   PROT 6.4 09/06/2020   ALBUMIN 4.1 09/01/2016   CALCIUM 9.6 09/06/2020   GFRAA 96 09/06/2020    Speciality Comments: No specialty comments available.  Procedures:  No procedures performed Allergies: Patient has no known allergies.   Assessment / Plan:     Visit Diagnoses: Rheumatoid arthritis with rheumatoid factor of multiple sites without organ or systems involvement (HCC)-patient had no synovitis on examination.  She has occasional stiffness in her hands with certain activities.  High risk medication use - Methotrexate 3 tablets by mouth once weekly and folic acid 1 mg po daily.  Her last labs from September 06, 2020 were normal.  We will continue to monitor labs every 3 months.  She has been advised to stop methotrexate in case she develops an infection.  Information regarding immunization was also placed in the AVS.  Primary osteoarthritis of both knees - s/p euflexxa bilateral knees 08/2020.  She had a  good response to Visco supplement injections.  Primary osteoarthritis of both feet-proper fitting shoes were discussed.  DDD (degenerative disc disease), lumbar-she denies any discomfort currently.  Plantar fasciitis - Resolved.  Other fatigue  Overactive bladder  Orders: No orders of the defined types were placed in this encounter.  No orders of the defined types were placed in this encounter.    Follow-Up Instructions: Return in about 5 months (around 03/23/2021) for Rheumatoid arthritis.   Pollyann Savoy, MD  Note - This record has been created using Animal nutritionist.  Chart creation errors have been sought, but may not always  have been located. Such creation errors do not reflect on  the standard of medical care.

## 2020-10-21 ENCOUNTER — Encounter: Payer: Self-pay | Admitting: Rheumatology

## 2020-10-21 ENCOUNTER — Other Ambulatory Visit: Payer: Self-pay

## 2020-10-21 ENCOUNTER — Ambulatory Visit: Payer: BC Managed Care – PPO | Admitting: Rheumatology

## 2020-10-21 VITALS — BP 110/75 | HR 61 | Ht 64.0 in | Wt 142.4 lb

## 2020-10-21 DIAGNOSIS — R5383 Other fatigue: Secondary | ICD-10-CM

## 2020-10-21 DIAGNOSIS — M19072 Primary osteoarthritis, left ankle and foot: Secondary | ICD-10-CM

## 2020-10-21 DIAGNOSIS — M0579 Rheumatoid arthritis with rheumatoid factor of multiple sites without organ or systems involvement: Secondary | ICD-10-CM | POA: Diagnosis not present

## 2020-10-21 DIAGNOSIS — M722 Plantar fascial fibromatosis: Secondary | ICD-10-CM

## 2020-10-21 DIAGNOSIS — Z79899 Other long term (current) drug therapy: Secondary | ICD-10-CM

## 2020-10-21 DIAGNOSIS — M19071 Primary osteoarthritis, right ankle and foot: Secondary | ICD-10-CM | POA: Diagnosis not present

## 2020-10-21 DIAGNOSIS — M5136 Other intervertebral disc degeneration, lumbar region: Secondary | ICD-10-CM

## 2020-10-21 DIAGNOSIS — M51369 Other intervertebral disc degeneration, lumbar region without mention of lumbar back pain or lower extremity pain: Secondary | ICD-10-CM

## 2020-10-21 DIAGNOSIS — N3281 Overactive bladder: Secondary | ICD-10-CM

## 2020-10-21 DIAGNOSIS — M170 Bilateral primary osteoarthritis of knee: Secondary | ICD-10-CM

## 2020-10-21 NOTE — Patient Instructions (Signed)
Standing Labs We placed an order today for your standing lab work.   Please have your standing labs drawn in September and every 3 months  If possible, please have your labs drawn 2 weeks prior to your appointment so that the provider can discuss your results at your appointment.  Please note that you may see your imaging and lab results in MyChart before we have reviewed them. We may be awaiting multiple results to interpret others before contacting you. Please allow our office up to 72 hours to thoroughly review all of the results before contacting the office for clarification of your results.  We have open lab daily: Monday through Thursday from 1:30-4:30 PM and Friday from 1:30-4:00 PM at the office of Dr. Lou Irigoyen, [facility].   Please be advised, all patients with office appointments requiring lab work will take precedent over walk-in lab work.  If possible, please come for your lab work on Monday and Friday afternoons, as you may experience shorter wait times. The office is located at [address], [address], [place], [place] [address] No appointment is necessary.   Labs are drawn by [facility]. Please bring your co-pay at the time of your lab draw.  You may receive a bill from [facility] for your lab work.  If you wish to have your labs drawn at another location, please call the office 24 hours in advance to send orders.  If you have any questions regarding directions or hours of operation,  please call [phone].   As a reminder, please drink plenty of water prior to coming for your lab work. Thanks!   Vaccines You are taking a medication(s) that can suppress your immune system.  The following immunizations are recommended: Flu annually Covid-19  Td/Tdap (tetanus, diphtheria, pertussis) every 10 years Pneumonia (Prevnar 15 then Pneumovax 23 at least 1 year apart.  Alternatively, can take Prevnar 20 without needing additional dose) Shingrix (after age 60): 2  doses from 4 weeks to 6 months apart  Please check with your PCP to make sure you are up to date.   If you test POSITIVE for COVID19 and have MILD to MODERATE symptoms: First, call your PCP if you would like to receive COVID19 treatment AND Hold your medications during the infection and for at least 1 week after your symptoms have resolved: Injectable medication (Benlysta, Cimzia, Cosentyx, Enbrel, Humira, Orencia, Remicade, Simponi, Stelara, Taltz, Tremfya) Methotrexate Leflunomide (Arava) Azathioprine Mycophenolate (Cellcept) Xeljanz, Olumiant, or Rinvoq Otezla If you take Actemra or Kevzara, you DO NOT need to hold these for COVID19 infection.  If you test POSITIVE for COVID19 and have NO symptoms: First, call your PCP if you would like to receive COVID19 treatment AND Hold your medications for at least 10 days after the day that you tested positive Injectable medication (Benlysta, Cimzia, Cosentyx, Enbrel, Humira, Orencia, Remicade, Simponi, Stelara, Taltz, Tremfya) Methotrexate Leflunomide (Arava) Azathioprine Mycophenolate (Cellcept) Xeljanz, Olumiant, or Rinvoq Otezla If you take Actemra or Kevzara, you DO NOT need to hold these for COVID19 infection.  If you have signs or symptoms of an infection or start antibiotics: First, call your PCP for workup of your infection. Hold your medication through the infection, until you complete your antibiotics, and until symptoms resolve if you take the following: Injectable medication (Actemra, Benlysta, Cimzia, Cosentyx, Enbrel, Humira, Kevzara, Orencia, Remicade, Simponi, Stelara, Taltz, Tremfya) Methotrexate Leflunomide (Arava) Mycophenolate (Cellcept) Xeljanz, Olumiant, or Rinvoq  Heart Disease Prevention   Your inflammatory disease increases your risk of   heart disease which includes heart attack, stroke, atrial fibrillation (irregular heartbeats), high blood pressure, heart failure and atherosclerosis (plaque in the arteries).   It is important to reduce your risk by:   Keep blood pressure, cholesterol, and blood sugar at healthy levels   Smoking Cessation   Maintain a healthy weight  BMI 20-25   Eat a healthy diet  Plenty of fresh fruit, vegetables, and whole grains  Limit saturated fats, foods high in sodium, and added sugars  DASH and Mediterranean diet   Increase physical activity  Recommend moderate physically activity for 150 minutes per week/ 30 minutes a day for five days a week These can be broken up into three separate ten-minute sessions during the day.   Reduce Stress  Meditation, slow breathing exercises, yoga, coloring books  Dental visits twice a year   

## 2020-11-04 ENCOUNTER — Other Ambulatory Visit: Payer: Self-pay | Admitting: Physician Assistant

## 2020-11-04 NOTE — Telephone Encounter (Signed)
Next Visit: 03/23/2021  Last Visit: 10/21/2020  Last Fill: 09/07/2020  DX: Rheumatoid arthritis with rheumatoid factor of multiple sites without organ or systems involvement   Current Dose per office note 10/21/2020: not discussed  Labs: 09/06/2020 CBC and CMP WNL  Okay to refill Mobic?

## 2020-12-01 ENCOUNTER — Telehealth: Payer: Self-pay | Admitting: Rheumatology

## 2020-12-01 DIAGNOSIS — Z79899 Other long term (current) drug therapy: Secondary | ICD-10-CM

## 2020-12-01 NOTE — Telephone Encounter (Signed)
Patient advised her lab orders have been faxed to Dr. Yetta Flock office at [phone] (fax)

## 2020-12-01 NOTE — Telephone Encounter (Signed)
Patient requesting lab orders to be sent to Dr. Abner Greenspan in [place]. Please advise once sent.

## 2020-12-02 NOTE — Telephone Encounter (Signed)
Attempted to contact the patient and left message to advise patient we received a confirmation the fax for the lab orders did go through yesterday. Advised patient to call our office so I can verify the phone number we have for Dr. Yetta Flock office. Will fax once we have confirmed correct information.

## 2020-12-02 NOTE — Telephone Encounter (Signed)
Patient calling to lwet you know Dr. Yetta Flock office stating they still have not received lab orders. Please refax, and call to verify Dr. Yetta Flock office received them.

## 2020-12-05 ENCOUNTER — Other Ambulatory Visit: Payer: Self-pay | Admitting: Physician Assistant

## 2020-12-06 NOTE — Telephone Encounter (Signed)
Next Visit: 03/23/2021   Last Visit: 10/21/2020   Last Fill: 08/24/2020   DX: Rheumatoid arthritis with rheumatoid factor of multiple sites without organ or systems involvement    Current Dose per office note 10/21/2020: Methotrexate 3 tablets by mouth once weekly   Labs: 09/06/2020 CBC and CMP WNL  Patient updating labs this week with PCP.    Okay to refill MTX?

## 2020-12-14 ENCOUNTER — Telehealth: Payer: Self-pay

## 2020-12-14 NOTE — Telephone Encounter (Signed)
Spoke with patient's significant other Oswaldo Done who is on dpr, advised we have not received her labs results. They will reach out to Dr. Yetta Flock office.

## 2020-12-14 NOTE — Telephone Encounter (Signed)
Patient called to confirm the office received her labwork results from Dr. Yetta Flock office.  Patient requested a return call.

## 2021-01-02 ENCOUNTER — Other Ambulatory Visit: Payer: Self-pay | Admitting: Rheumatology

## 2021-01-03 NOTE — Telephone Encounter (Signed)
Next Visit: 03/23/2021   Last Visit: 10/21/2020   Last Fill: 11/04/2020 (30 day supply with 1 refill)  DX: Rheumatoid arthritis with rheumatoid factor of multiple sites without organ or systems involvement    Current Dose per office note 10/21/2020: not discussed  Labs: 09/06/2020 CBC and CMP WNL    Okay to refill Mobic?

## 2021-02-21 ENCOUNTER — Telehealth: Payer: Self-pay

## 2021-02-21 ENCOUNTER — Other Ambulatory Visit: Payer: Self-pay | Admitting: *Deleted

## 2021-02-21 DIAGNOSIS — Z79899 Other long term (current) drug therapy: Secondary | ICD-10-CM

## 2021-02-21 NOTE — Telephone Encounter (Signed)
Patient called requesting her labwork orders be sent to her PCP Dr. Yetta Flock.  Patient states once they receive the orders she will be able to schedule an appointment.

## 2021-02-21 NOTE — Telephone Encounter (Signed)
Patient advised we have faxed lab orders.

## 2021-02-21 NOTE — Telephone Encounter (Signed)
Lab Orders released and faxed.  °

## 2021-02-25 ENCOUNTER — Other Ambulatory Visit: Payer: Self-pay | Admitting: Physician Assistant

## 2021-02-25 LAB — CMP14+EGFR
ALT: 24 IU/L (ref 0–32)
AST: 24 IU/L (ref 0–40)
Albumin/Globulin Ratio: 2 (ref 1.2–2.2)
Albumin: 4.3 g/dL (ref 3.8–4.9)
Alkaline Phosphatase: 80 IU/L (ref 44–121)
BUN/Creatinine Ratio: 14 (ref 12–28)
BUN: 10 mg/dL (ref 8–27)
Bilirubin Total: 0.2 mg/dL (ref 0.0–1.2)
CO2: 24 mmol/L (ref 20–29)
Calcium: 9 mg/dL (ref 8.7–10.3)
Chloride: 105 mmol/L (ref 96–106)
Creatinine, Ser: 0.72 mg/dL (ref 0.57–1.00)
Globulin, Total: 2.1 g/dL (ref 1.5–4.5)
Glucose: 99 mg/dL (ref 70–99)
Potassium: 5 mmol/L (ref 3.5–5.2)
Sodium: 142 mmol/L (ref 134–144)
Total Protein: 6.4 g/dL (ref 6.0–8.5)
eGFR: 96 mL/min/{1.73_m2} (ref >59)

## 2021-02-25 NOTE — Telephone Encounter (Signed)
Next Visit: 03/23/2021   Last Visit: 10/21/2020   Last Fill: 12/06/2020    DX: Rheumatoid arthritis with rheumatoid factor of multiple sites without organ or systems involvement    Current Dose per office note 10/21/2020: not discussed   Labs: 02/24/2021 CMP is normal.  Okay to refill MTX?

## 2021-02-25 NOTE — Progress Notes (Signed)
CMP is normal.

## 2021-02-26 LAB — CBC WITH DIFFERENTIAL/PLATELET
Basophils Absolute: 0.1 10*3/uL (ref 0.0–0.2)
Basos: 1 %
EOS (ABSOLUTE): 0.2 10*3/uL (ref 0.0–0.4)
Eos: 3 %
Hematocrit: 40.6 % (ref 34.0–46.6)
Hemoglobin: 13.2 g/dL (ref 11.1–15.9)
Immature Grans (Abs): 0 10*3/uL (ref 0.0–0.1)
Immature Granulocytes: 0 %
Lymphocytes Absolute: 2.4 10*3/uL (ref 0.7–3.1)
Lymphs: 37 %
MCH: 30 pg (ref 26.6–33.0)
MCHC: 32.5 g/dL (ref 31.5–35.7)
MCV: 92 fL (ref 79–97)
Monocytes Absolute: 0.8 10*3/uL (ref 0.1–0.9)
Monocytes: 12 %
Neutrophils Absolute: 3.1 10*3/uL (ref 1.4–7.0)
Neutrophils: 47 %
Platelets: 284 10*3/uL (ref 150–450)
RBC: 4.4 x10E6/uL (ref 3.77–5.28)
RDW: 12.9 % (ref 11.7–15.4)
WBC: 6.5 10*3/uL (ref 3.4–10.8)

## 2021-02-27 NOTE — Progress Notes (Signed)
CBC and CMP are normal.

## 2021-03-09 NOTE — Progress Notes (Signed)
Office Visit Note  Patient: Cathy Casey             Date of Birth: 1960/09/30           MRN: [id number]             PCP: Marco Collie, MD Referring: Marco Collie, MD Visit Date: 03/23/2021 Occupation: @GUAROCC @  Subjective:  Right knee joint pain   History of Present Illness: Cathy Casey is a 60 y.o. female with history of seropositive rheumatoid arthritis and osteoarthritis.  She is prescribed methotrexate 3 tablets by mouth once weekly and folic acid 1 mg daily.  She has been holding methotrexate for the past 2 weeks due to contracting upper respiratory tract infection.  She continues to have some residual congestion so she has not restarted methotrexate yet.  She denies any signs or symptoms of a rheumatoid arthritis flare since holding methotrexate.  She continues to experience intermittent discomfort in both hands and both feet due to cold weather.  She denies any joint swelling in her hands or feet at this time.  She presents today with increased pain in the right knee was started 2 months ago.  She denies any injury or fall prior to the onset of symptoms.  She states she has been experiencing locking in the right knee intermittently and has had severe pain and difficulty climbing steps.  She feels that her right knee is going to give out or buckle when going up and down steps.  She is also noticed some swelling in her right knee.  She is no longer taking meloxicam but has tried using Aspercreme topically as needed for pain relief and has been using a heating pad.  She denies using any ice or brace.  She states that in the past Visco gel injections alleviated her knee joint pain significantly.  She states her left knee joint is doing well.     Activities of Daily Living:  Patient reports morning stiffness for 5-10 minutes  Patient Reports nocturnal pain.  Difficulty dressing/grooming: Denies Difficulty climbing stairs: Reports Difficulty getting out of chair: Reports Difficulty using  hands for taps, buttons, cutlery, and/or writing: Reports  Review of Systems  Constitutional:  Positive for fatigue.  HENT:  Negative for mouth sores, mouth dryness and nose dryness.   Eyes:  Negative for pain, visual disturbance and dryness.  Respiratory:  Positive for cough. Negative for hemoptysis, shortness of breath and difficulty breathing.   Cardiovascular:  Negative for chest pain, palpitations, hypertension and swelling in legs/feet.  Gastrointestinal:  Negative for blood in stool, constipation and diarrhea.  Endocrine: Negative for increased urination.  Genitourinary:  Negative for painful urination.  Musculoskeletal:  Positive for joint pain, joint pain, joint swelling and morning stiffness. Negative for myalgias, muscle weakness, muscle tenderness and myalgias.  Skin:  Negative for color change, pallor, rash, hair loss, nodules/bumps, skin tightness, ulcers and sensitivity to sunlight.  Allergic/Immunologic: Negative for susceptible to infections.  Neurological:  Negative for dizziness, numbness, headaches and weakness.  Hematological:  Negative for swollen glands.  Psychiatric/Behavioral:  Negative for depressed mood and sleep disturbance. The patient is not nervous/anxious.    PMFS History:  Patient Active Problem List   Diagnosis Date Noted   Rheumatoid arthritis with rheumatoid factor of multiple sites without organ or systems involvement ([place]) 04/03/2016   High risk medications (not anticoagulants) long-term use 04/03/2016   Primary osteoarthritis of both knees 03/29/2016   Chronic pain of both knees 03/29/2016  Past Medical History:  Diagnosis Date   Overactive bladder    Rheumatoid arthritis ([place])     Family History  Problem Relation Age of Onset   Diabetes Mother    Rheum arthritis Sister    Diabetes Sister    Healthy Son    Healthy Son    Healthy Son    Past Surgical History:  Procedure Laterality Date   ABDOMINAL HYSTERECTOMY     CHOLECYSTECTOMY      Social History   Social History Narrative   Not on file   Immunization History  Administered Date(s) Administered   Moderna Sars-Covid-2 Vaccination 05/21/2019, 06/18/2019, 12/12/2019, 05/27/2020     Objective: Vital Signs: BP 127/83 (BP Location: Left Arm, Patient Position: Sitting, Cuff Size: Normal)    Pulse 62    Ht 5\' 3"  (1.6 m)    Wt 148 lb 6.4 oz (67.3 kg)    BMI 26.29 kg/m    Physical Exam Vitals and nursing note reviewed.  Constitutional:      Appearance: She is well-developed.  HENT:     Head: Normocephalic and atraumatic.  Eyes:     Conjunctiva/sclera: Conjunctivae normal.  Pulmonary:     Effort: Pulmonary effort is normal.  Abdominal:     Palpations: Abdomen is soft.  Musculoskeletal:     Cervical back: Normal range of motion.  Skin:    General: Skin is warm and dry.     Capillary Refill: Capillary refill takes less than 2 seconds.  Neurological:     Mental Status: She is alert and oriented to person, place, and time.  Psychiatric:        Behavior: Behavior normal.     Musculoskeletal Exam: C-spine, thoracic spine, lumbar spine have good range of motion with no discomfort.  Shoulder joints, elbow joints, wrist joints, MCPs, PIPs, DIPs have good range of motion with no synovitis.  Complete fist formation bilaterally.  Some PIP and DIP thickening consistent with osteoarthritis of both hands.  Hip joints have good range of motion with no groin pain.  She has some discomfort with range of motion of the right knee joint with mild warmth but no effusion noted.  Left knee joint has good range of motion with no warmth or effusion.  Ankle joints have good range of motion with no tenderness or joint swelling.  No tenderness over MTP joints.  CDAI Exam: CDAI Score: -- Patient Global: --; Provider Global: -- Swollen: --; Tender: -- Joint Exam 03/23/2021   No joint exam has been documented for this visit   There is currently no information documented on the  homunculus. Go to the Rheumatology activity and complete the homunculus joint exam.  Investigation: No additional findings.  Imaging: No results found.  Recent Labs: Lab Results  Component Value Date   WBC 6.5 02/25/2021   HGB 13.2 02/25/2021   PLT 284 02/25/2021   NA 142 02/24/2021   K 5.0 02/24/2021   CL 105 02/24/2021   CO2 24 02/24/2021   GLUCOSE 99 02/24/2021   BUN 10 02/24/2021   CREATININE 0.72 02/24/2021   BILITOT 0.2 02/24/2021   ALKPHOS 80 02/24/2021   AST 24 02/24/2021   ALT 24 02/24/2021   PROT 6.4 02/24/2021   ALBUMIN 4.3 02/24/2021   CALCIUM 9.0 02/24/2021   GFRAA 96 09/06/2020    Speciality Comments: No specialty comments available.  Procedures:  No procedures performed Allergies: Patient has no known allergies.   Assessment / Plan:     Visit  Diagnoses: Rheumatoid arthritis with rheumatoid factor of multiple sites without organ or systems involvement [facility]): She has not had any signs or symptoms of a rheumatoid arthritis flare recently.  Her much arthritis has been well controlled on methotrexate 3 tablets once weekly and folic acid 1 mg daily.  She has been holding methotrexate for the past 2 weeks due to being diagnosed with an upper respiratory tract infection.  She continues to have some residual congestion so she has not restarted methotrexate yet.  She has not noticed any increased joint pain or inflammation while holding methotrexate.  She has occasional pain in both hands and both feet but has not noticed any joint swelling.  She attributes these arthralgias to weather changes.  She will remain on methotrexate as prescribed.  She does not need a refill at this time.  She was advised to notify us if she develops signs or symptoms of a flare.  She will follow-up in the office in 5 months.  High risk medication use - Methotrexate 3 tablets by mouth once weekly and folic acid 1 mg po daily. CMP updated on 02/24/2021.  CBC updated on 02/25/2021.  She will be due  to update lab work in March and every 3 months.  Standing orders for CBC and CMP are in place.   She is currently holding methotrexate due to a recent upper respiratory tract infection.  Discussed the importance of always holding methotrexate if she develops signs or symptoms of an infection and to resume once the infection has completely cleared.  Primary osteoarthritis of both knees: She noticed significant improvement in her knee joint discomfort after undergoing Visco gel injections in June 2022.  About 2 months ago she started to have some increased discomfort in the right knee joint especially when climbing steps.  She is also been experiencing intermittent buckling and locking of the right knee.  On examination she has good range of motion of the right knee joint with some warmth but no effusion.  No obvious instability was noted.  X-rays of the right knee joint were updated today.  The right knee was injected with cortisone as discussed below.  Procedure note was completed above.  If her symptoms persist or worsen we will proceed with an MRI for further evaluation.  She will notify us when she would like to reapply for Visco gel injections in the future since she noticed so much clinical improvement in the past.  Chronic pain of right knee -She presents today with increased pain in the right knee joint which started about 2 months ago.  She did not have any injury or fall prior to the onset of symptoms.  Her discomfort has been most severe when climbing up and down steps.  She has been experiencing intermittent buckling and locking in the right knee.  She has also noticed intermittent swelling.  She has tried using Aspercreme as well as heat with minimal pain relief.  She is no longer taking meloxicam.  In the past she noticed significant improvement in her knee joint pain after undergoing Visco gel injections.  The most recent series was completed in June 2022 which provided significant relief until  about 2 months ago.  X-rays of the right knee were updated today.  No acute abnormalities were seen.  The right knee joint was injected with cortisone and the procedure note was completed above.  Aftercare was discussed.  If her symptoms persist or worsen we can proceed with an MRI of the  right knee for further evaluation to rule out an internal derangement.  She was in agreement.  Discussed the importance of lower extremity muscle strengthening once her symptoms have improved.  She will notify us if her symptoms persist or worsen.  Plan: XR KNEE 3 VIEW RIGHT  Primary osteoarthritis of both feet: She has occasional discomfort in both feet which she attributes to weather changes.  She had good range of motion of both ankle joints on examination today with no tenderness or inflammation.  No tenderness over MTP joints.  She was wearing proper fitting shoes.  DDD (degenerative disc disease), lumbar: She is not experiencing any increased discomfort in her lower back at this time.  No symptoms of radiculopathy.  Other medical conditions are listed as follows:  Plantar fasciitis: Resolved.   Other fatigue: She has been experiencing some increased fatigue secondary to a recent upper respiratory tract infection.  Overactive bladder  Orders: Orders Placed This Encounter  Procedures   XR KNEE 3 VIEW RIGHT   No orders of the defined types were placed in this encounter.    Follow-Up Instructions: Return in about 5 months (around 08/21/2021) for Rheumatoid arthritis, Osteoarthritis.   Ofilia Neas, PA-C  Note - This record has been created using Dragon software.  Chart creation errors have been sought, but may not always  have been located. Such creation errors do not reflect on  the standard of medical care.

## 2021-03-23 ENCOUNTER — Other Ambulatory Visit: Payer: Self-pay

## 2021-03-23 ENCOUNTER — Ambulatory Visit: Payer: BC Managed Care – PPO | Admitting: Physician Assistant

## 2021-03-23 ENCOUNTER — Encounter: Payer: Self-pay | Admitting: Physician Assistant

## 2021-03-23 ENCOUNTER — Ambulatory Visit: Payer: Self-pay

## 2021-03-23 VITALS — BP 127/83 | HR 62 | Ht 63.0 in | Wt 148.4 lb

## 2021-03-23 DIAGNOSIS — M19071 Primary osteoarthritis, right ankle and foot: Secondary | ICD-10-CM

## 2021-03-23 DIAGNOSIS — N3281 Overactive bladder: Secondary | ICD-10-CM

## 2021-03-23 DIAGNOSIS — M25561 Pain in right knee: Secondary | ICD-10-CM | POA: Diagnosis not present

## 2021-03-23 DIAGNOSIS — M0579 Rheumatoid arthritis with rheumatoid factor of multiple sites without organ or systems involvement: Secondary | ICD-10-CM

## 2021-03-23 DIAGNOSIS — G8929 Other chronic pain: Secondary | ICD-10-CM

## 2021-03-23 DIAGNOSIS — R5383 Other fatigue: Secondary | ICD-10-CM

## 2021-03-23 DIAGNOSIS — Z79899 Other long term (current) drug therapy: Secondary | ICD-10-CM

## 2021-03-23 DIAGNOSIS — M19072 Primary osteoarthritis, left ankle and foot: Secondary | ICD-10-CM

## 2021-03-23 DIAGNOSIS — M5136 Other intervertebral disc degeneration, lumbar region: Secondary | ICD-10-CM

## 2021-03-23 DIAGNOSIS — M722 Plantar fascial fibromatosis: Secondary | ICD-10-CM

## 2021-03-23 DIAGNOSIS — M170 Bilateral primary osteoarthritis of knee: Secondary | ICD-10-CM | POA: Diagnosis not present

## 2021-03-23 NOTE — Patient Instructions (Signed)
Standing Labs °We placed an order today for your standing lab work.  ° °Please have your standing labs drawn in March and every 3 months  ° ° °If possible, please have your labs drawn 2 weeks prior to your appointment so that the provider can discuss your results at your appointment. ° °Please note that you may see your imaging and lab results in MyChart before we have reviewed them. °We may be awaiting multiple results to interpret others before contacting you. °Please allow our office up to 72 hours to thoroughly review all of the results before contacting the office for clarification of your results. ° °We have open lab daily: °Monday through Thursday from 1:30-4:30 PM and Friday from 1:30-4:00 PM °at the office of Dr. Shaili Deveshwar, [facility].   °Please be advised, all patients with office appointments requiring lab work will take precedent over walk-in lab work.  °If possible, please come for your lab work on Monday and Friday afternoons, as you may experience shorter wait times. °The office is located at [address], [address], [place], [place] [address] °No appointment is necessary.   °Labs are drawn by [facility]. Please bring your co-pay at the time of your lab draw.  You may receive a bill from [facility] for your lab work. ° °If you wish to have your labs drawn at another location, please call the office 24 hours in advance to send orders. ° °If you have any questions regarding directions or hours of operation,  °please call [phone].   °As a reminder, please drink plenty of water prior to coming for your lab work. Thanks! ° °

## 2021-05-20 ENCOUNTER — Telehealth: Payer: Self-pay

## 2021-05-20 ENCOUNTER — Telehealth: Payer: Self-pay | Admitting: Rheumatology

## 2021-05-20 ENCOUNTER — Other Ambulatory Visit: Payer: Self-pay | Admitting: *Deleted

## 2021-05-20 DIAGNOSIS — Z79899 Other long term (current) drug therapy: Secondary | ICD-10-CM

## 2021-05-20 NOTE — Telephone Encounter (Signed)
Patient left a voicemail stating she had missed a call from some one in the office and would like a return call. Patient states she believes it was about labs. ?

## 2021-05-20 NOTE — Telephone Encounter (Signed)
Patient called requesting her labwork orders be sent to [facility] in [place] on [facility].  Patient did not know if it was [facility] or [facility], but said it is the same place Sue Lush sent them to in December.  Patient plans to go this afternoon, 05/20/21. ?

## 2021-05-20 NOTE — Telephone Encounter (Signed)
Lab in [place] is [facility]. Orders released for patient.  ?

## 2021-05-20 NOTE — Telephone Encounter (Signed)
Left message to advise patient we have not reached out to her. Advised her that she is due for labs this month. Advised patient to call the office to advise where she would like to have those done at and we will release the orders.  ?

## 2021-05-24 ENCOUNTER — Other Ambulatory Visit: Payer: Self-pay | Admitting: *Deleted

## 2021-05-24 LAB — CMP14+EGFR
ALT: 19 IU/L (ref 0–32)
AST: 21 IU/L (ref 0–40)
Albumin/Globulin Ratio: 2.4 — ABNORMAL HIGH (ref 1.2–2.2)
Albumin: 4.3 g/dL (ref 3.8–4.9)
Alkaline Phosphatase: 88 IU/L (ref 44–121)
BUN/Creatinine Ratio: 18 (ref 12–28)
BUN: 14 mg/dL (ref 8–27)
Bilirubin Total: <0.2 mg/dL (ref 0.0–1.2)
CO2: 25 mmol/L (ref 20–29)
Calcium: 8.8 mg/dL (ref 8.7–10.3)
Chloride: 109 mmol/L — ABNORMAL HIGH (ref 96–106)
Creatinine, Ser: 0.8 mg/dL (ref 0.57–1.00)
Globulin, Total: 1.8 g/dL (ref 1.5–4.5)
Glucose: 77 mg/dL (ref 70–99)
Potassium: 4.2 mmol/L (ref 3.5–5.2)
Sodium: 145 mmol/L — ABNORMAL HIGH (ref 134–144)
Total Protein: 6.1 g/dL (ref 6.0–8.5)
eGFR: 84 mL/min/{1.73_m2} (ref >59)

## 2021-05-24 LAB — CBC WITH DIFFERENTIAL/PLATELET
Basophils Absolute: 0.1 10*3/uL (ref 0.0–0.2)
Basos: 1 %
EOS (ABSOLUTE): 0.2 10*3/uL (ref 0.0–0.4)
Eos: 3 %
Hematocrit: 35.3 % (ref 34.0–46.6)
Hemoglobin: 12.3 g/dL (ref 11.1–15.9)
Immature Grans (Abs): 0 10*3/uL (ref 0.0–0.1)
Immature Granulocytes: 0 %
Lymphocytes Absolute: 2.3 10*3/uL (ref 0.7–3.1)
Lymphs: 41 %
MCH: 30.9 pg (ref 26.6–33.0)
MCHC: 34.8 g/dL (ref 31.5–35.7)
MCV: 89 fL (ref 79–97)
Monocytes Absolute: 0.7 10*3/uL (ref 0.1–0.9)
Monocytes: 12 %
Neutrophils Absolute: 2.5 10*3/uL (ref 1.4–7.0)
Neutrophils: 43 %
Platelets: 271 10*3/uL (ref 150–450)
RBC: 3.98 x10E6/uL (ref 3.77–5.28)
RDW: 12.4 % (ref 11.7–15.4)
WBC: 5.7 10*3/uL (ref 3.4–10.8)

## 2021-05-24 MED ORDER — METHOTREXATE 2.5 MG PO TABS: ORAL_TABLET | ORAL | 0 refills | Status: DC

## 2021-05-24 NOTE — Telephone Encounter (Signed)
Next Visit: 09/06/2021 ? ?Last Visit: 03/23/2021 ? ?Last Fill: 02/28/2021 ? ?DX: Rheumatoid arthritis with rheumatoid factor of multiple sites without organ or systems involvement  ? ?Current Dose per office note 03/23/2021: Methotrexate 3 tablets by mouth once weekly ? ?Labs: 05/23/2021, CBC WNL.  Sodium and chloride are borderline elevated.  Albumin/globulin ratio is slightly elevated. We will continue to monitor.   ? ?Okay to refill MTX? ? ?

## 2021-05-30 ENCOUNTER — Other Ambulatory Visit: Payer: Self-pay | Admitting: Rheumatology

## 2021-05-30 NOTE — Telephone Encounter (Signed)
Next Visit: 09/06/2021 ?  ?Last Visit: 03/23/2021 ?  ?Last Fill: 03/15/2020 ? ?DX: Rheumatoid arthritis with rheumatoid factor of multiple sites without organ or systems involvement  ?  ?Current Dose per office note 03/23/2021: folic acid 1 mg po daily ? ?Okay to refill Folic Acid?  ?

## 2021-06-08 ENCOUNTER — Ambulatory Visit (INDEPENDENT_AMBULATORY_CARE_PROVIDER_SITE_OTHER): Payer: BC Managed Care – PPO

## 2021-06-08 ENCOUNTER — Ambulatory Visit: Payer: BC Managed Care – PPO | Admitting: Podiatry

## 2021-06-08 ENCOUNTER — Other Ambulatory Visit: Payer: Self-pay

## 2021-06-08 DIAGNOSIS — Q666 Other congenital valgus deformities of feet: Secondary | ICD-10-CM | POA: Diagnosis not present

## 2021-06-08 DIAGNOSIS — M722 Plantar fascial fibromatosis: Secondary | ICD-10-CM

## 2021-06-09 ENCOUNTER — Telehealth: Payer: Self-pay

## 2021-06-09 NOTE — Telephone Encounter (Signed)
Casts sent to [facility] °

## 2021-06-10 ENCOUNTER — Encounter: Payer: Self-pay | Admitting: Podiatry

## 2021-06-10 NOTE — Progress Notes (Signed)
?Subjective:  ?Patient ID: Cathy Casey, female    DOB: 19-Jul-1960,  MRN: [id number] ? ?No chief complaint on file. ? ? ?61 y.o. female presents with the above complaint.  Patient presents with complaint bilateral heel pain left is worse than right side.  She states the pain is in the heel and the arch of the foot.  She states hurts with ambulation hurts with taking for step in the morning.  Has progressive gotten worse.  She would like to discuss treatment options for it.  Pain scale is 8 out of 10 dull aching nature. ? ? ?Review of Systems: Negative except as noted in the HPI. Denies N/V/F/Ch. ? ?Past Medical History:  ?Diagnosis Date  ? Overactive bladder   ? Rheumatoid arthritis ([place])   ? ? ?Current Outpatient Medications:  ?  folic acid (FOLVITE) 1 MG tablet, TAKE 1 TABLET(1 MG) BY MOUTH DAILY, Disp: 90 tablet, Rfl: 4 ?  meloxicam (MOBIC) 15 MG tablet, TAKE 1 TABLET(15 MG) BY MOUTH DAILY AS NEEDED FOR PAIN, Disp: 90 tablet, Rfl: 0 ?  methotrexate (RHEUMATREX) 2.5 MG tablet, TAKE 3 TABLETS BY MOUTH 1 TIME A WEEK, Disp: 36 tablet, Rfl: 0 ?  Multiple Vitamins-Minerals (MULTIVITAMIN WITH MINERALS) tablet, Take 1 tablet by mouth daily., Disp: , Rfl:  ?  MYRBETRIQ 25 MG TB24 tablet, TK 1 T PO D (Patient not taking: Reported on 10/21/2020), Disp: , Rfl: 5 ?  naproxen (NAPROSYN) 500 MG tablet, Take 500 mg by mouth as needed. , Disp: , Rfl:  ?  Semaglutide (OZEMPIC, 0.25 OR 0.5 MG/DOSE, [place]), Inject into the skin once a week., Disp: , Rfl:  ? ?Social History  ? ?Tobacco Use  ?Smoking Status Never  ?Smokeless Tobacco Never  ? ? ?No Known Allergies ?Objective:  ?There were no vitals filed for this visit. ?There is no height or weight on file to calculate BMI. ?Constitutional Well developed. ?Well nourished.  ?Vascular Dorsalis pedis pulses palpable bilaterally. ?Posterior tibial pulses palpable bilaterally. ?Capillary refill normal to all digits.  ?No cyanosis or clubbing noted. ?Pedal hair growth normal.  ?Neurologic Normal  speech. ?Oriented to person, place, and time. ?Epicritic sensation to light touch grossly present bilaterally.  ?Dermatologic Nails well groomed and normal in appearance. ?No open wounds. ?No skin lesions.  ?Orthopedic: Normal joint ROM without pain or crepitus bilaterally. ?No visible deformities. ?Tender to palpation at the calcaneal tuber bilaterally. ?No pain with calcaneal squeeze bilaterally. ?Ankle ROM diminished range of motion bilaterally. ?Silfverskiold Test: positive bilaterally.  ? ?Radiographs: Taken and reviewed. No acute fractures or dislocations. No evidence of stress fracture.  Plantar heel spur present. Posterior heel spur absent.  Pes planovalgus foot structure noted ? ?Assessment:  ? ?1. Plantar fasciitis of right foot   ?2. Plantar fasciitis of left foot   ?3. Pes planovalgus   ? ?Plan:  ?Patient was evaluated and treated and all questions answered. ? ?Plantar Fasciitis, bilaterally ?- XR reviewed as above.  ?- Educated on icing and stretching. Instructions given.  ?- Injection delivered to the plantar fascia as below. ?- DME: Plantar fascial brace dispensed to support the medial longitudinal arch of the foot and offload pressure from the heel and prevent arch collapse during weightbearing ?- Pharmacologic management: None ? ?Procedure: Injection Tendon/Ligament ?Location: Bilateral plantar fascia at the glabrous junction; medial approach. ?Skin Prep: alcohol ?Injectate: 0.5 cc 0.5% marcaine plain, 0.5 cc of 1% Lidocaine, 0.5 cc kenalog 10. ?Disposition: Patient tolerated procedure well. Injection site dressed with a band-aid. ? ?  Pes planovalgus ?-I explained to patient the etiology of pes planovalgus and relationship with Planter fasciitis and various treatment options were discussed.  Given patient foot structure in the setting of Planter fasciitis I believe patient will benefit from custom-made orthotics to help control the hindfoot motion support the arch of the foot and take the stress away  from plantar fascial.  Patient agrees with the plan like to proceed with orthotics ?-Patient was casted for orthotics ? ? ?No follow-ups on file. ?

## 2021-07-13 ENCOUNTER — Encounter: Payer: Self-pay | Admitting: Podiatry

## 2021-07-13 ENCOUNTER — Ambulatory Visit: Payer: BC Managed Care – PPO | Admitting: Podiatry

## 2021-07-13 DIAGNOSIS — M722 Plantar fascial fibromatosis: Secondary | ICD-10-CM

## 2021-07-13 DIAGNOSIS — Q666 Other congenital valgus deformities of feet: Secondary | ICD-10-CM | POA: Diagnosis not present

## 2021-07-19 NOTE — Progress Notes (Signed)
?Subjective:  ?Patient ID: Cathy Casey, female    DOB: 05-04-1960,  MRN: [id number] ? ?Chief Complaint  ?Patient presents with  ? Plantar Fasciitis  ? ? ?61 y.o. female presents with the above complaint.  Patient presents for follow-up bilateral plantar fasciitis.  She states that she is doing a lot better the injection helped.  She still has some residual pain left is still worse.  She would like to discuss another injection option. ? ? ?Review of Systems: Negative except as noted in the HPI. Denies N/V/F/Ch. ? ?Past Medical History:  ?Diagnosis Date  ? Overactive bladder   ? Rheumatoid arthritis ([facility])   ? ? ?Current Outpatient Medications:  ?  folic acid (FOLVITE) 1 MG tablet, TAKE 1 TABLET(1 MG) BY MOUTH DAILY, Disp: 90 tablet, Rfl: 4 ?  meloxicam (MOBIC) 15 MG tablet, TAKE 1 TABLET(15 MG) BY MOUTH DAILY AS NEEDED FOR PAIN, Disp: 90 tablet, Rfl: 0 ?  methotrexate (RHEUMATREX) 2.5 MG tablet, TAKE 3 TABLETS BY MOUTH 1 TIME A WEEK, Disp: 36 tablet, Rfl: 0 ?  Multiple Vitamins-Minerals (MULTIVITAMIN WITH MINERALS) tablet, Take 1 tablet by mouth daily., Disp: , Rfl:  ?  MYRBETRIQ 25 MG TB24 tablet, TK 1 T PO D (Patient not taking: Reported on 10/21/2020), Disp: , Rfl: 5 ?  naproxen (NAPROSYN) 500 MG tablet, Take 500 mg by mouth as needed. , Disp: , Rfl:  ?  Semaglutide (OZEMPIC, 0.25 OR 0.5 MG/DOSE, [place]), Inject into the skin once a week., Disp: , Rfl:  ? ?Social History  ? ?Tobacco Use  ?Smoking Status Never  ?Smokeless Tobacco Never  ? ? ?No Known Allergies ?Objective:  ?There were no vitals filed for this visit. ?There is no height or weight on file to calculate BMI. ?Constitutional Well developed. ?Well nourished.  ?Vascular Dorsalis pedis pulses palpable bilaterally. ?Posterior tibial pulses palpable bilaterally. ?Capillary refill normal to all digits.  ?No cyanosis or clubbing noted. ?Pedal hair growth normal.  ?Neurologic Normal speech. ?Oriented to person, place, and time. ?Epicritic sensation to light touch  grossly present bilaterally.  ?Dermatologic Nails well groomed and normal in appearance. ?No open wounds. ?No skin lesions.  ?Orthopedic: Normal joint ROM without pain or crepitus bilaterally. ?No visible deformities. ?Tender to palpation at the calcaneal tuber bilaterally. ?No pain with calcaneal squeeze bilaterally. ?Ankle ROM diminished range of motion bilaterally. ?Silfverskiold Test: positive bilaterally.  ? ?Radiographs: Taken and reviewed. No acute fractures or dislocations. No evidence of stress fracture.  Plantar heel spur present. Posterior heel spur absent.  Pes planovalgus foot structure noted ? ?Assessment:  ? ?1. Pes planovalgus   ?2. Plantar fasciitis of left foot   ?3. Plantar fasciitis of right foot   ? ? ?Plan:  ?Patient was evaluated and treated and all questions answered. ? ?Plantar Fasciitis, bilaterally ?- XR reviewed as above.  ?- Educated on icing and stretching. Instructions given.  ?-Second injection delivered to the plantar fascia as below. ?- DME: Plantar fascial brace dispensed to support the medial longitudinal arch of the foot and offload pressure from the heel and prevent arch collapse during weightbearing ?- Pharmacologic management: None ? ?Procedure: Injection Tendon/Ligament ?Location: Bilateral plantar fascia at the glabrous junction; medial approach. ?Skin Prep: alcohol ?Injectate: 0.5 cc 0.5% marcaine plain, 0.5 cc of 1% Lidocaine, 0.5 cc kenalog 10. ?Disposition: Patient tolerated procedure well. Injection site dressed with a band-aid. ? ?Pes planovalgus ?-I explained to patient the etiology of pes planovalgus and relationship with Planter fasciitis and various treatment options were  discussed.  Given patient foot structure in the setting of Planter fasciitis I believe patient will benefit from custom-made orthotics to help control the hindfoot motion support the arch of the foot and take the stress away from plantar fascial.  Patient agrees with the plan like to proceed with  orthotics ?-Orthotics were dispensed and are functioning well. ? ? ?No follow-ups on file. ?

## 2021-07-26 ENCOUNTER — Encounter: Payer: Self-pay | Admitting: Podiatry

## 2021-07-26 ENCOUNTER — Telehealth: Payer: Self-pay | Admitting: Podiatry

## 2021-07-26 NOTE — Telephone Encounter (Signed)
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  ?

## 2021-07-27 ENCOUNTER — Telehealth: Payer: Self-pay | Admitting: Podiatry

## 2021-07-27 ENCOUNTER — Encounter: Payer: Self-pay | Admitting: Podiatry

## 2021-08-05 ENCOUNTER — Other Ambulatory Visit: Payer: Self-pay | Admitting: Physician Assistant

## 2021-08-05 NOTE — Telephone Encounter (Signed)
Next Visit: 09/06/2021   Last Visit: 03/23/2021   Last Fill: 05/24/2021   DX: Rheumatoid arthritis with rheumatoid factor of multiple sites without organ or systems involvement    Current Dose per office note 03/23/2021: Methotrexate 3 tablets by mouth once weekly   Labs: 05/23/2021, CBC WNL.  Sodium and chloride are borderline elevated.  Albumin/globulin ratio is slightly elevated. We will continue to monitor.     Okay to refill MTX?

## 2021-08-11 NOTE — Telephone Encounter (Signed)
error 

## 2021-08-17 ENCOUNTER — Ambulatory Visit: Payer: BC Managed Care – PPO | Admitting: Podiatry

## 2021-08-28 NOTE — Progress Notes (Signed)
Office Visit Note  Patient: Cathy Casey             Date of Birth: 19-Mar-1961           MRN: [id number]             PCP: Abner Greenspan, MD Referring: Abner Greenspan, MD Visit Date: 09/06/2021 Occupation: @GUAROCC @  Subjective:  Medication management  History of Present Illness: Cathy Casey is a 61 y.o. female with history of seropositive rheumatoid arthritis and osteoarthritis.  She states she has intermittent discomfort and swelling in her hands and her left knee joint.  She has not had any recent problems.  She has been taking methotrexate 3 tablets p.o. weekly without any side effects.  She continues to have intermittent bilateral planter fasciitis.  She has been followed by podiatrist.  She denies any lower back discomfort today.  Activities of Daily Living:  Patient reports morning stiffness for 10 minutes.   Patient Denies nocturnal pain.  Difficulty dressing/grooming: Denies Difficulty climbing stairs: Reports Difficulty getting out of chair: Reports Difficulty using hands for taps, buttons, cutlery, and/or writing: Denies  Review of Systems  Constitutional:  Positive for fatigue.  HENT:  Negative for mouth dryness.   Eyes:  Negative for dryness.  Respiratory:  Negative for shortness of breath.   Cardiovascular:  Positive for swelling in legs/feet.  Gastrointestinal:  Negative for constipation.  Endocrine: Negative for excessive thirst.  Genitourinary:  Negative for difficulty urinating.  Musculoskeletal:  Positive for joint pain, gait problem, joint pain, joint swelling and morning stiffness.  Skin:  Negative for rash.  Allergic/Immunologic: Negative for susceptible to infections.  Neurological:  Negative for numbness.  Hematological:  Negative for bruising/bleeding tendency.  Psychiatric/Behavioral:  Positive for sleep disturbance.     PMFS History:  Patient Active Problem List   Diagnosis Date Noted   Rheumatoid arthritis with rheumatoid factor of multiple sites  without organ or systems involvement (HCC) 04/03/2016   High risk medications (not anticoagulants) long-term use 04/03/2016   Primary osteoarthritis of both knees 03/29/2016   Chronic pain of both knees 03/29/2016    Past Medical History:  Diagnosis Date   Overactive bladder    Rheumatoid arthritis ([facility])     Family History  Problem Relation Age of Onset   Diabetes Mother    Rheum arthritis Sister    Diabetes Sister    Healthy Son    Healthy Son    Healthy Son    Past Surgical History:  Procedure Laterality Date   ABDOMINAL HYSTERECTOMY     CHOLECYSTECTOMY     Social History   Social History Narrative   Not on file   Immunization History  Administered Date(s) Administered   Moderna Sars-Covid-2 Vaccination 05/21/2019, 06/18/2019, 12/12/2019, 05/27/2020     Objective: Vital Signs: BP 124/79 (BP Location: Left Arm, Patient Position: Sitting, Cuff Size: Normal)   Pulse 66   Resp 15   Ht 5\' 3"  (1.6 m)   Wt 149 lb 3.2 oz (67.7 kg)   BMI 26.43 kg/m    Physical Exam Vitals and nursing note reviewed.  Constitutional:      Appearance: She is well-developed.  HENT:     Head: Normocephalic and atraumatic.  Eyes:     Conjunctiva/sclera: Conjunctivae normal.  Cardiovascular:     Rate and Rhythm: Normal rate and regular rhythm.     Heart sounds: Normal heart sounds.  Pulmonary:     Effort: Pulmonary effort is normal.  Breath sounds: Normal breath sounds.  Abdominal:     General: Bowel sounds are normal.     Palpations: Abdomen is soft.  Musculoskeletal:     Cervical back: Normal range of motion.  Lymphadenopathy:     Cervical: No cervical adenopathy.  Skin:    General: Skin is warm and dry.     Capillary Refill: Capillary refill takes less than 2 seconds.  Neurological:     Mental Status: She is alert and oriented to person, place, and time.  Psychiatric:        Behavior: Behavior normal.      Musculoskeletal Exam: C-spine, thoracic and lumbar spine were  in good range of motion.  Shoulder joints, elbow joints, wrist joints, MCPs PIPs and DIPs with good range of motion with no synovitis.  Hip joints, knee joints, ankles, MTPs and PIPs with good range of motion.  She had tenderness over bilateral plantar fascia.  CDAI Exam: CDAI Score: 0  Patient Global: 0 mm; Provider Global: 0 mm Swollen: 0 ; Tender: 0  Joint Exam 09/06/2021   No joint exam has been documented for this visit   There is currently no information documented on the homunculus. Go to the Rheumatology activity and complete the homunculus joint exam.  Investigation: No additional findings.  Imaging: No results found.  Recent Labs: Lab Results  Component Value Date   WBC 5.7 05/23/2021   HGB 12.3 05/23/2021   PLT 271 05/23/2021   NA 145 (H) 05/23/2021   K 4.2 05/23/2021   CL 109 (H) 05/23/2021   CO2 25 05/23/2021   GLUCOSE 77 05/23/2021   BUN 14 05/23/2021   CREATININE 0.80 05/23/2021   BILITOT <0.2 05/23/2021   ALKPHOS 88 05/23/2021   AST 21 05/23/2021   ALT 19 05/23/2021   PROT 6.1 05/23/2021   ALBUMIN 4.3 05/23/2021   CALCIUM 8.8 05/23/2021   GFRAA 96 09/06/2020    Speciality Comments: No specialty comments available.  Procedures:  No procedures performed Allergies: Patient has no known allergies.   Assessment / Plan:     Visit Diagnoses: Rheumatoid arthritis with rheumatoid factor of multiple sites without organ or systems involvement (HCC)-she has been doing well on methotrexate 3 tablets p.o. weekly.  She gives history of intermittent swelling in her hands and her left knee joint.  No synovitis was noted.  Patient states she did not have any recent episodes of swelling.  She is on low-dose methotrexate.  High risk medication use - Methotrexate 3 tablets p.o. weekly and folic acid 1 mg p.o. daily.  May 23, 2021 CBC with differential and CMP with GFR normal. - Her labs have been stable.  We will check labs today and then every 3 months to monitor for  drug toxicity.Plan: CBC with Differential/Platelet, COMPLETE METABOLIC PANEL WITH GFR.  Information about immunization was placed in the AVS.  She was also advised to hold methotrexate in case she develops an infection and resume after the infection resolves.  Primary osteoarthritis of both knees - 01/04/ 2023 x-ray of the right knee showed moderate osteoarthritis and moderate chondromalacia patella.  She received a cortisone injection.  She had viscosupplement injections in June 2022.  She gives history of intermittent swelling in her left knee joint.  No warmth swelling or effusion was noted.  Primary osteoarthritis of both feet-  Plantar fasciitis -she had mild tenderness over bilateral plantar fascia.  She has been followed by podiatrist.  DDD (degenerative disc disease), lumbar-she denies any discomfort  today.  Overactive bladder  Orders: Orders Placed This Encounter  Procedures   CBC with Differential/Platelet   COMPLETE METABOLIC PANEL WITH GFR   No orders of the defined types were placed in this encounter.    Follow-Up Instructions: Return in about 5 months (around 02/06/2022) for Rheumatoid arthritis.   Pollyann Savoy, MD  Note - This record has been created using Animal nutritionist.  Chart creation errors have been sought, but may not always  have been located. Such creation errors do not reflect on  the standard of medical care.

## 2021-09-05 ENCOUNTER — Ambulatory Visit: Payer: BC Managed Care – PPO | Admitting: Rheumatology

## 2021-09-06 ENCOUNTER — Encounter: Payer: Self-pay | Admitting: Rheumatology

## 2021-09-06 ENCOUNTER — Ambulatory Visit: Payer: BC Managed Care – PPO | Admitting: Rheumatology

## 2021-09-06 VITALS — BP 124/79 | HR 66 | Resp 15 | Ht 63.0 in | Wt 149.2 lb

## 2021-09-06 DIAGNOSIS — M0579 Rheumatoid arthritis with rheumatoid factor of multiple sites without organ or systems involvement: Secondary | ICD-10-CM

## 2021-09-06 DIAGNOSIS — M19071 Primary osteoarthritis, right ankle and foot: Secondary | ICD-10-CM | POA: Diagnosis not present

## 2021-09-06 DIAGNOSIS — Z79899 Other long term (current) drug therapy: Secondary | ICD-10-CM

## 2021-09-06 DIAGNOSIS — M170 Bilateral primary osteoarthritis of knee: Secondary | ICD-10-CM

## 2021-09-06 DIAGNOSIS — M5136 Other intervertebral disc degeneration, lumbar region: Secondary | ICD-10-CM

## 2021-09-06 DIAGNOSIS — N3281 Overactive bladder: Secondary | ICD-10-CM

## 2021-09-06 DIAGNOSIS — M19072 Primary osteoarthritis, left ankle and foot: Secondary | ICD-10-CM

## 2021-09-06 DIAGNOSIS — M722 Plantar fascial fibromatosis: Secondary | ICD-10-CM

## 2021-09-06 NOTE — Patient Instructions (Signed)
Standing Labs We placed an order today for your standing lab work.   Please have your standing labs drawn in September and every 3 months  If possible, please have your labs drawn 2 weeks prior to your appointment so that the provider can discuss your results at your appointment.  Please note that you may see your imaging and lab results in MyChart before we have reviewed them. We may be awaiting multiple results to interpret others before contacting you. Please allow our office up to 72 hours to thoroughly review all of the results before contacting the office for clarification of your results.  We have open lab daily: Monday through Thursday from 1:30-4:30 PM and Friday from 1:30-4:00 PM at the office of Dr. Laurieanne Galloway, [facility].   Please be advised, all patients with office appointments requiring lab work will take precedent over walk-in lab work.  If possible, please come for your lab work on Monday and Friday afternoons, as you may experience shorter wait times. The office is located at [address], [address], [place], [place] [address] No appointment is necessary.   Labs are drawn by [facility]. Please bring your co-pay at the time of your lab draw.  You may receive a bill from [facility] for your lab work.  Please note if you are on Hydroxychloroquine and and an order has been placed for a Hydroxychloroquine level, you will need to have it drawn 4 hours or more after your last dose.  If you wish to have your labs drawn at another location, please call the office 24 hours in advance to send orders.  If you have any questions regarding directions or hours of operation,  please call [phone].   As a reminder, please drink plenty of water prior to coming for your lab work. Thanks!   Vaccines You are taking a medication(s) that can suppress your immune system.  The following immunizations are recommended: Flu annually Covid-19  Td/Tdap (tetanus, diphtheria,  pertussis) every 10 years Pneumonia (Prevnar 15 then Pneumovax 23 at least 1 year apart.  Alternatively, can take Prevnar 20 without needing additional dose) Shingrix: 2 doses from 4 weeks to 6 months apart  Please check with your PCP to make sure you are up to date.   If you have signs or symptoms of an infection or start antibiotics: First, call your PCP for workup of your infection. Hold your medication through the infection, until you complete your antibiotics, and until symptoms resolve if you take the following: Injectable medication (Actemra, Benlysta, Cimzia, Cosentyx, Enbrel, Humira, Kevzara, Orencia, Remicade, Simponi, Stelara, Taltz, Tremfya) Methotrexate Leflunomide (Arava) Mycophenolate (Cellcept) Xeljanz, Olumiant, or Rinvoq  

## 2021-09-07 LAB — COMPLETE METABOLIC PANEL WITH GFR
AG Ratio: 1.8 (calc) (ref 1.0–2.5)
ALT: 15 U/L (ref 6–29)
AST: 20 U/L (ref 10–35)
Albumin: 4 g/dL (ref 3.6–5.1)
Alkaline phosphatase (APISO): 71 U/L (ref 37–153)
BUN: 13 mg/dL (ref 7–25)
CO2: 26 mmol/L (ref 20–32)
Calcium: 9.6 mg/dL (ref 8.6–10.4)
Chloride: 108 mmol/L (ref 98–110)
Creat: 0.77 mg/dL (ref 0.50–1.05)
Globulin: 2.2 g/dL (calc) (ref 1.9–3.7)
Glucose, Bld: 78 mg/dL (ref 65–99)
Potassium: 5.2 mmol/L (ref 3.5–5.3)
Sodium: 141 mmol/L (ref 135–146)
Total Bilirubin: 0.4 mg/dL (ref 0.2–1.2)
Total Protein: 6.2 g/dL (ref 6.1–8.1)
eGFR: 88 mL/min/{1.73_m2} (ref >=60)

## 2021-09-07 LAB — CBC WITH DIFFERENTIAL/PLATELET
Absolute Monocytes: 728 cells/uL (ref 200–950)
Basophils Absolute: 48 cells/uL (ref 0–200)
Basophils Relative: 0.7 %
Eosinophils Absolute: 150 cells/uL (ref 15–500)
Eosinophils Relative: 2.2 %
HCT: 39.5 % (ref 35.0–45.0)
Hemoglobin: 13.1 g/dL (ref 11.7–15.5)
Lymphs Abs: 1856 cells/uL (ref 850–3900)
MCH: 31 pg (ref 27.0–33.0)
MCHC: 33.2 g/dL (ref 32.0–36.0)
MCV: 93.4 fL (ref 80.0–100.0)
MPV: 10.8 fL (ref 7.5–12.5)
Monocytes Relative: 10.7 %
Neutro Abs: 4019 cells/uL (ref 1500–7800)
Neutrophils Relative %: 59.1 %
Platelets: 299 10*3/uL (ref 140–400)
RBC: 4.23 10*6/uL (ref 3.80–5.10)
RDW: 12.3 % (ref 11.0–15.0)
Total Lymphocyte: 27.3 %
WBC: 6.8 10*3/uL (ref 3.8–10.8)

## 2021-11-06 ENCOUNTER — Other Ambulatory Visit: Payer: Self-pay | Admitting: Rheumatology

## 2021-11-07 ENCOUNTER — Other Ambulatory Visit: Payer: Self-pay | Admitting: *Deleted

## 2021-11-07 ENCOUNTER — Telehealth: Payer: Self-pay | Admitting: Rheumatology

## 2021-11-07 DIAGNOSIS — Z79899 Other long term (current) drug therapy: Secondary | ICD-10-CM

## 2021-11-07 NOTE — Telephone Encounter (Signed)
Lab Orders released.  

## 2021-11-07 NOTE — Telephone Encounter (Signed)
Next Visit: 02/06/2022  Last Visit: 09/06/2021  Last Fill: 08/05/2021  DX: Rheumatoid arthritis with rheumatoid factor of multiple sites without organ or systems involvemen  Current Dose per office note 09/06/2021: Methotrexate 3 tablets p.o. weekly   Labs: 09/06/2021 CBC and CMP WNL  Okay to refill MTX?

## 2021-11-07 NOTE — Telephone Encounter (Signed)
Patient called requesting her labwork orders be sent to [facility] in [place].  Patient states she plans to go this afternoon.

## 2021-11-09 LAB — CMP14+EGFR
ALT: 18 IU/L (ref 0–32)
AST: 25 IU/L (ref 0–40)
Albumin/Globulin Ratio: 2 (ref 1.2–2.2)
Albumin: 4.5 g/dL (ref 3.9–4.9)
Alkaline Phosphatase: 75 IU/L (ref 44–121)
BUN/Creatinine Ratio: 15 (ref 12–28)
BUN: 13 mg/dL (ref 8–27)
Bilirubin Total: 0.3 mg/dL (ref 0.0–1.2)
CO2: 22 mmol/L (ref 20–29)
Calcium: 9.6 mg/dL (ref 8.7–10.3)
Chloride: 103 mmol/L (ref 96–106)
Creatinine, Ser: 0.84 mg/dL (ref 0.57–1.00)
Globulin, Total: 2.2 g/dL (ref 1.5–4.5)
Glucose: 88 mg/dL (ref 70–99)
Potassium: 4.3 mmol/L (ref 3.5–5.2)
Sodium: 141 mmol/L (ref 134–144)
Total Protein: 6.7 g/dL (ref 6.0–8.5)
eGFR: 79 mL/min/{1.73_m2} (ref >59)

## 2021-11-09 LAB — CBC WITH DIFFERENTIAL/PLATELET
Basophils Absolute: 0.1 10*3/uL (ref 0.0–0.2)
Basos: 1 %
EOS (ABSOLUTE): 0.1 10*3/uL (ref 0.0–0.4)
Eos: 1 %
Hematocrit: 37.9 % (ref 34.0–46.6)
Hemoglobin: 13.4 g/dL (ref 11.1–15.9)
Immature Grans (Abs): 0 10*3/uL (ref 0.0–0.1)
Immature Granulocytes: 0 %
Lymphocytes Absolute: 2.1 10*3/uL (ref 0.7–3.1)
Lymphs: 28 %
MCH: 30.9 pg (ref 26.6–33.0)
MCHC: 35.4 g/dL (ref 31.5–35.7)
MCV: 88 fL (ref 79–97)
Monocytes Absolute: 1 10*3/uL — ABNORMAL HIGH (ref 0.1–0.9)
Monocytes: 13 %
Neutrophils Absolute: 4.4 10*3/uL (ref 1.4–7.0)
Neutrophils: 57 %
Platelets: 301 10*3/uL (ref 150–450)
RBC: 4.33 x10E6/uL (ref 3.77–5.28)
RDW: 11.9 % (ref 11.7–15.4)
WBC: 7.7 10*3/uL (ref 3.4–10.8)

## 2021-11-09 NOTE — Progress Notes (Signed)
CBC and CMP normal

## 2022-01-03 NOTE — Progress Notes (Signed)
Office Visit Note  Patient: Cathy Casey             Date of Birth: Apr 30, 1960           MRN: [id number]             PCP: Abner Greenspan, MD Referring: Abner Greenspan, MD Visit Date: 01/17/2022 Occupation: @GUAROCC @  Subjective:  Left trochanteric bursitis  History of Present Illness: Cathy Casey is a 61 y.o. female with history of seropositive rheumatoid arthritis and osteoarthritis.  Patient remains on Methotrexate 3 tablets p.o. weekly and folic acid 1 mg p.o. daily.  She is tolerating methotrexate without any side effects and has not missed any doses recently.  She denies any signs or symptoms of a rheumatoid arthritis flare.  Her morning stiffness has been lasting 5 minutes daily.  She has not had any nocturnal pain.  She denies any difficulty with ADLs.  She presents today with discomfort on the lateral aspect of her left hip.  She denies any groin pain.  She has not had any recent injury or fall.  Her left hip pain started 1 month ago and is exacerbated by lying on her sides at night.  She states that both knee joints are doing well after undergoing Visco gel injections in June 2022.  She denies any joint swelling at this time.  She denies any new medical conditions.  She denies any recent or recurrent infections.  She is up-to-date with the flu vaccine as well as the COVID booster.   Activities of Daily Living:  Patient reports morning stiffness for 5 minutes.   Patient Denies nocturnal pain.  Difficulty dressing/grooming: Denies Difficulty climbing stairs: Reports Difficulty getting out of chair: Denies Difficulty using hands for taps, buttons, cutlery, and/or writing: Denies  Review of Systems  Constitutional:  Positive for fatigue.  HENT:  Negative for mouth sores and mouth dryness.   Eyes:  Negative for dryness.  Respiratory:  Negative for shortness of breath.   Cardiovascular:  Negative for chest pain and palpitations.  Gastrointestinal:  Negative for blood in stool,  constipation and diarrhea.  Endocrine: Negative for increased urination.  Genitourinary:  Negative for involuntary urination.  Musculoskeletal:  Positive for morning stiffness. Negative for joint pain, gait problem, joint pain, joint swelling, myalgias, muscle weakness, muscle tenderness and myalgias.  Skin:  Positive for hair loss. Negative for color change, rash and sensitivity to sunlight.  Allergic/Immunologic: Negative for susceptible to infections.  Neurological:  Negative for dizziness and headaches.  Hematological:  Negative for swollen glands.  Psychiatric/Behavioral:  Negative for depressed mood and sleep disturbance. The patient is not nervous/anxious.     PMFS History:  Patient Active Problem List   Diagnosis Date Noted   Rheumatoid arthritis with rheumatoid factor of multiple sites without organ or systems involvement (HCC) 04/03/2016   High risk medications (not anticoagulants) long-term use 04/03/2016   Primary osteoarthritis of both knees 03/29/2016   Chronic pain of both knees 03/29/2016    Past Medical History:  Diagnosis Date   Overactive bladder    Rheumatoid arthritis (HCC)     Family History  Problem Relation Age of Onset   Diabetes Mother    Rheum arthritis Sister    Diabetes Sister    Healthy Son    Healthy Son    Healthy Son    Past Surgical History:  Procedure Laterality Date   ABDOMINAL HYSTERECTOMY     CHOLECYSTECTOMY     Social History   Social  History Narrative   Not on file   Immunization History  Administered Date(s) Administered   Moderna Sars-Covid-2 Vaccination 05/21/2019, 06/18/2019, 12/12/2019, 05/27/2020     Objective: Vital Signs: BP 134/84 (BP Location: Left Arm, Patient Position: Sitting, Cuff Size: Normal)   Pulse 62   Resp 15   Ht 5\' 3"  (1.6 m)   Wt 156 lb 3.2 oz (70.9 kg)   BMI 27.67 kg/m    Physical Exam Vitals and nursing note reviewed.  Constitutional:      Appearance: She is well-developed.  HENT:     Head:  Normocephalic and atraumatic.  Eyes:     Conjunctiva/sclera: Conjunctivae normal.  Cardiovascular:     Rate and Rhythm: Normal rate and regular rhythm.     Heart sounds: Normal heart sounds.  Pulmonary:     Effort: Pulmonary effort is normal.     Breath sounds: Normal breath sounds.  Abdominal:     General: Bowel sounds are normal.     Palpations: Abdomen is soft.  Musculoskeletal:     Cervical back: Normal range of motion.  Skin:    General: Skin is warm and dry.     Capillary Refill: Capillary refill takes less than 2 seconds.  Neurological:     Mental Status: She is alert and oriented to person, place, and time.  Psychiatric:        Behavior: Behavior normal.      Musculoskeletal Exam: C-spine, thoracic spine, lumbar spine have good range of motion.  No midline spinal tenderness.  Shoulder joints, elbow joints, wrist joints, MCPs, PIPs, DIPs have good range of motion with no synovitis.  Complete fist formation bilaterally.  Hip joints have good range of motion with no groin pain.  Tenderness over the left trochanteric bursa.  Knee joints have good range of motion with no warmth or effusion.  Ankle joints have good range of motion with no tenderness or joint swelling.  CDAI Exam: CDAI Score: -- Patient Global: 0 mm; Provider Global: 0 mm Swollen: --; Tender: -- Joint Exam 01/17/2022   No joint exam has been documented for this visit   There is currently no information documented on the homunculus. Go to the Rheumatology activity and complete the homunculus joint exam.  Investigation: No additional findings.  Imaging: No results found.  Recent Labs: Lab Results  Component Value Date   WBC 7.7 11/07/2021   HGB 13.4 11/07/2021   PLT 301 11/07/2021   NA 141 11/07/2021   K 4.3 11/07/2021   CL 103 11/07/2021   CO2 22 11/07/2021   GLUCOSE 88 11/07/2021   BUN 13 11/07/2021   CREATININE 0.84 11/07/2021   BILITOT 0.3 11/07/2021   ALKPHOS 75 11/07/2021   AST 25  11/07/2021   ALT 18 11/07/2021   PROT 6.7 11/07/2021   ALBUMIN 4.5 11/07/2021   CALCIUM 9.6 11/07/2021   GFRAA 96 09/06/2020    Speciality Comments: No specialty comments available.  Procedures:  Large Joint Inj: L greater trochanter on 01/17/2022 3:44 PM Indications: pain Details: 27 G 1.5 in needle, lateral approach  Arthrogram: No  Medications: 1.5 mL lidocaine 1 %; 40 mg triamcinolone acetonide 40 MG/ML Aspirate: 0 mL Outcome: tolerated well, no immediate complications Procedure, treatment alternatives, risks and benefits explained, specific risks discussed. Consent was given by the patient. Immediately prior to procedure a time out was called to verify the correct patient, procedure, equipment, support staff and site/side marked as required. Patient was prepped and draped in the usual  sterile fashion.     Allergies: Patient has no known allergies.   Assessment / Plan:     Visit Diagnoses: Rheumatoid arthritis with rheumatoid factor of multiple sites without organ or systems involvement [facility]): She has no joint tenderness or synovitis on examination.  She has not had any signs or symptoms of a rheumatoid arthritis flare.  She has clinically been doing well taking methotrexate 3 tablets by mouth once weekly and folic acid 1 mg daily.  Her morning stiffness has been lasting 5 minutes or less.  She has not had any nocturnal pain or difficulty with ADLs.  She is tolerating methotrexate without any side effects and has not missed any doses recently.  She has not had any recent or recurrent infections. She will remain on methotrexate as monotherapy.  She was advised to notify us if she develops signs or symptoms of a flare.  She will follow-up in the office in 5 months or sooner if needed.  High risk medication use - Methotrexate 3 tablets by mouth once weekly and folic acid 1 mg p.o. daily. CBC and CMP updated on 11/07/21.  Orders for CBC and CMP were released today.  Her next lab work  will be due in February and every 3 months to monitor for drug toxicity. She has not had any recent or recurrent infections.  Discussed the importance of holding methotrexate if she develops signs or symptoms of an infection and to resume once the infection has completely cleared.  She received the annual flu shot and the updated COVID booster.   - Plan: CBC with Differential/Platelet, COMPLETE METABOLIC PANEL WITH GFR  Primary osteoarthritis of both knees - 01/04/ 2023 x-ray of the right knee showed moderate osteoarthritis and moderate chondromalacia patella. She had viscosupplement injections in June 2022.  She noticed significant clinical improvement after undergoing Visco gel injections.  She has good range of motion of both knee joints on examination today.  No warmth or effusion noted.  Primary osteoarthritis of both feet: She has good range of motion of both ankle joints on examination.  No tenderness or synovitis noted.  She is wearing proper fitting shoes.  Plantar fasciitis: Bilateral.  Patient wears orthotics.  Patient has had cortisone injections in the past.  DDD (degenerative disc disease), lumbar: She is not experiencing any increased discomfort in her lower back at this time.  She has no midline spinal tenderness.  No symptoms of radiculopathy.  Trochanteric bursitis, left hip: Patient presents today with pain on the lateral aspect of the left hip consistent with trochanter bursitis.  Her symptoms started 1 month ago with no injury or fall prior to the onset of symptoms.  On examination she has good range of motion of the left hip joint with no groin pain.  She has tenderness over the left trochanteric bursa.  Different treatment options were discussed today in detail.  The left trochanteric bursa was injected with cortisone after informed consent was provided.  The procedure note was completed above.  Aftercare was discussed.  Overactive bladder  Orders: Orders Placed This  Encounter  Procedures   Large Joint Inj   CBC with Differential/Platelet   COMPLETE METABOLIC PANEL WITH GFR   No orders of the defined types were placed in this encounter.     Follow-Up Instructions: Return in about 5 months (around 06/18/2022) for Rheumatoid arthritis, Osteoarthritis.   Ofilia Neas, PA-C  Note - This record has been created using Dragon software.  Chart creation errors  have been sought, but may not always  have been located. Such creation errors do not reflect on  the standard of medical care.

## 2022-01-17 ENCOUNTER — Encounter: Payer: Self-pay | Admitting: Physician Assistant

## 2022-01-17 ENCOUNTER — Ambulatory Visit: Payer: BC Managed Care – PPO | Attending: Physician Assistant | Admitting: Physician Assistant

## 2022-01-17 VITALS — BP 134/84 | HR 62 | Resp 15 | Ht 63.0 in | Wt 156.2 lb

## 2022-01-17 DIAGNOSIS — M19071 Primary osteoarthritis, right ankle and foot: Secondary | ICD-10-CM

## 2022-01-17 DIAGNOSIS — Z79899 Other long term (current) drug therapy: Secondary | ICD-10-CM | POA: Diagnosis not present

## 2022-01-17 DIAGNOSIS — M7062 Trochanteric bursitis, left hip: Secondary | ICD-10-CM | POA: Diagnosis not present

## 2022-01-17 DIAGNOSIS — M170 Bilateral primary osteoarthritis of knee: Secondary | ICD-10-CM

## 2022-01-17 DIAGNOSIS — M722 Plantar fascial fibromatosis: Secondary | ICD-10-CM

## 2022-01-17 DIAGNOSIS — M0579 Rheumatoid arthritis with rheumatoid factor of multiple sites without organ or systems involvement: Secondary | ICD-10-CM | POA: Diagnosis not present

## 2022-01-17 DIAGNOSIS — M5136 Other intervertebral disc degeneration, lumbar region: Secondary | ICD-10-CM

## 2022-01-17 DIAGNOSIS — M19072 Primary osteoarthritis, left ankle and foot: Secondary | ICD-10-CM

## 2022-01-17 DIAGNOSIS — N3281 Overactive bladder: Secondary | ICD-10-CM

## 2022-01-17 MED ORDER — LIDOCAINE HCL 1 % IJ SOLN
1.5000 mL | INTRAMUSCULAR | Status: AC | PRN
  Administered 2022-01-17: 1.5 mL

## 2022-01-17 MED ORDER — TRIAMCINOLONE ACETONIDE 40 MG/ML IJ SUSP
40.0000 mg | INTRAMUSCULAR | Status: AC | PRN
  Administered 2022-01-17: 40 mg via INTRA_ARTICULAR

## 2022-01-17 NOTE — Patient Instructions (Signed)
Standing Labs We placed an order today for your standing lab work.   Please have your standing labs drawn in early February and every 3 months  Please have your labs drawn 2 weeks prior to your appointment so that the provider can discuss your lab results at your appointment.  Please note that you may see your imaging and lab results in MyChart before we have reviewed them. We will contact you once all results are reviewed. Please allow our office up to 72 hours to thoroughly review all of the results before contacting the office for clarification of your results.  Lab hours are:   Monday through Thursday from 8:00 am -12:30 pm and 1:00 pm-5:00 pm and Friday from 8:00 am-12:00 pm.  Please be advised, all patients with office appointments requiring lab work will take precedent over walk-in lab work.   Labs are drawn by [facility]. Please bring your co-pay at the time of your lab draw.  You may receive a bill from [facility] for your lab work.  Please note if you are on Hydroxychloroquine and and an order has been placed for a Hydroxychloroquine level, you will need to have it drawn 4 hours or more after your last dose.  If you wish to have your labs drawn at another location, please call the office 24 hours in advance so we can fax the orders.  The office is located at [address], [address], [place], [place] [address] No appointment is necessary.    If you have any questions regarding directions or hours of operation,  please call [phone].   As a reminder, please drink plenty of water prior to coming for your lab work. Thanks!  If you have signs or symptoms of an infection or start antibiotics: First, call your PCP for workup of your infection. Hold your medication through the infection, until you complete your antibiotics, and until symptoms resolve if you take the following: Injectable medication (Actemra, Benlysta, Cimzia, Cosentyx, Enbrel, Humira, Kevzara, Orencia, Remicade,  Simponi, Stelara, Taltz, Tremfya) Methotrexate Leflunomide (Arava) Mycophenolate (Cellcept) Xeljanz, Olumiant, or Rinvoq   Vaccines You are taking a medication(s) that can suppress your immune system.  The following immunizations are recommended: Flu annually Covid-19  Td/Tdap (tetanus, diphtheria, pertussis) every 10 years Pneumonia (Prevnar 15 then Pneumovax 23 at least 1 year apart.  Alternatively, can take Prevnar 20 without needing additional dose) Shingrix: 2 doses from 4 weeks to 6 months apart  Please check with your PCP to make sure you are up to date.   

## 2022-01-18 LAB — COMPLETE METABOLIC PANEL WITH GFR
AG Ratio: 1.8 (calc) (ref 1.0–2.5)
ALT: 26 U/L (ref 6–29)
AST: 25 U/L (ref 10–35)
Albumin: 4.1 g/dL (ref 3.6–5.1)
Alkaline phosphatase (APISO): 83 U/L (ref 37–153)
BUN: 15 mg/dL (ref 7–25)
CO2: 30 mmol/L (ref 20–32)
Calcium: 9.1 mg/dL (ref 8.6–10.4)
Chloride: 108 mmol/L (ref 98–110)
Creat: 0.79 mg/dL (ref 0.50–1.05)
Globulin: 2.3 g/dL (calc) (ref 1.9–3.7)
Glucose, Bld: 84 mg/dL (ref 65–99)
Potassium: 5.3 mmol/L (ref 3.5–5.3)
Sodium: 143 mmol/L (ref 135–146)
Total Bilirubin: 0.2 mg/dL (ref 0.2–1.2)
Total Protein: 6.4 g/dL (ref 6.1–8.1)
eGFR: 85 mL/min/{1.73_m2} (ref >=60)

## 2022-01-18 LAB — CBC WITH DIFFERENTIAL/PLATELET
Absolute Monocytes: 987 cells/uL — ABNORMAL HIGH (ref 200–950)
Basophils Absolute: 63 cells/uL (ref 0–200)
Basophils Relative: 0.9 %
Eosinophils Absolute: 154 cells/uL (ref 15–500)
Eosinophils Relative: 2.2 %
HCT: 36.4 % (ref 35.0–45.0)
Hemoglobin: 12.2 g/dL (ref 11.7–15.5)
Lymphs Abs: 2429 cells/uL (ref 850–3900)
MCH: 30.7 pg (ref 27.0–33.0)
MCHC: 33.5 g/dL (ref 32.0–36.0)
MCV: 91.5 fL (ref 80.0–100.0)
MPV: 11.2 fL (ref 7.5–12.5)
Monocytes Relative: 14.1 %
Neutro Abs: 3367 cells/uL (ref 1500–7800)
Neutrophils Relative %: 48.1 %
Platelets: 284 10*3/uL (ref 140–400)
RBC: 3.98 10*6/uL (ref 3.80–5.10)
RDW: 12.6 % (ref 11.0–15.0)
Total Lymphocyte: 34.7 %
WBC: 7 10*3/uL (ref 3.8–10.8)

## 2022-01-18 NOTE — Progress Notes (Signed)
CMP WNL.  Absolute monocytes borderline elevated. Rest of CBC WNL.  We will continue to monitor.

## 2022-01-23 ENCOUNTER — Other Ambulatory Visit: Payer: Self-pay | Admitting: Rheumatology

## 2022-01-23 NOTE — Telephone Encounter (Signed)
Next Visit: 4/3/20214  Last Visit: 01/17/2022  Last Fill: 11/07/2021  DX:  Rheumatoid arthritis with rheumatoid factor of multiple sites without organ or systems involvement   Current Dose per office note 01/17/2022: Methotrexate 3 tablets by mouth once weekly   Labs: 01/17/2022 CMP WNL.Absolute monocytes borderline elevated. Rest of CBC WNL.  We will continue to monitor.  Okay to refill MTX?

## 2022-01-25 ENCOUNTER — Ambulatory Visit: Payer: BC Managed Care – PPO | Admitting: Physician Assistant

## 2022-02-06 ENCOUNTER — Ambulatory Visit: Payer: BC Managed Care – PPO | Admitting: Physician Assistant

## 2022-04-10 ENCOUNTER — Other Ambulatory Visit: Payer: Self-pay | Admitting: Rheumatology

## 2022-04-10 DIAGNOSIS — Z79899 Other long term (current) drug therapy: Secondary | ICD-10-CM

## 2022-04-10 MED ORDER — METHOTREXATE SODIUM 2.5 MG PO TABS: ORAL_TABLET | ORAL | 0 refills | Status: DC

## 2022-04-10 NOTE — Telephone Encounter (Signed)
Patient called requesting her labwork orders be sent to [place] at [facility] in [place].  Patient plans to go this afternoon, 04/10/22.    Patient states she also needs a prescription refill of Methotrexate after Dr. Estanislado Pandy reviews her lab results.

## 2022-04-10 NOTE — Telephone Encounter (Signed)
Lab Orders released.   Next Visit: 06/21/2022  Last Visit: 01/17/2022  Last Fill: 01/23/2022  DX: Rheumatoid arthritis with rheumatoid factor of multiple sites without organ or systems involvement   Current Dose per office note 01/17/2022: Methotrexate 3 tablets by mouth once weekly    Labs: 01/17/2022 CMP WNL. Absolute monocytes borderline elevated. Rest of CBC WNL.  We will continue to monitor.  Okay to refill MTX?

## 2022-04-11 LAB — CBC WITH DIFFERENTIAL/PLATELET
Basophils Absolute: 0.1 10*3/uL (ref 0.0–0.2)
Basos: 1 %
EOS (ABSOLUTE): 0.1 10*3/uL (ref 0.0–0.4)
Eos: 2 %
Hematocrit: 36.9 % (ref 34.0–46.6)
Hemoglobin: 12.6 g/dL (ref 11.1–15.9)
Immature Grans (Abs): 0 10*3/uL (ref 0.0–0.1)
Immature Granulocytes: 0 %
Lymphocytes Absolute: 2.2 10*3/uL (ref 0.7–3.1)
Lymphs: 35 %
MCH: 30 pg (ref 26.6–33.0)
MCHC: 34.1 g/dL (ref 31.5–35.7)
MCV: 88 fL (ref 79–97)
Monocytes Absolute: 0.8 10*3/uL (ref 0.1–0.9)
Monocytes: 13 %
Neutrophils Absolute: 3.2 10*3/uL (ref 1.4–7.0)
Neutrophils: 49 %
Platelets: 271 10*3/uL (ref 150–450)
RBC: 4.2 x10E6/uL (ref 3.77–5.28)
RDW: 12 % (ref 11.7–15.4)
WBC: 6.5 10*3/uL (ref 3.4–10.8)

## 2022-04-11 LAB — CMP14+EGFR
ALT: 25 IU/L (ref 0–32)
AST: 27 IU/L (ref 0–40)
Albumin/Globulin Ratio: 1.9 (ref 1.2–2.2)
Albumin: 4.1 g/dL (ref 3.9–4.9)
Alkaline Phosphatase: 93 IU/L (ref 44–121)
BUN/Creatinine Ratio: 15 (ref 12–28)
BUN: 11 mg/dL (ref 8–27)
Bilirubin Total: <0.2 mg/dL (ref 0.0–1.2)
CO2: 24 mmol/L (ref 20–29)
Calcium: 9.1 mg/dL (ref 8.7–10.3)
Chloride: 102 mmol/L (ref 96–106)
Creatinine, Ser: 0.73 mg/dL (ref 0.57–1.00)
Globulin, Total: 2.2 g/dL (ref 1.5–4.5)
Glucose: 87 mg/dL (ref 70–99)
Potassium: 4.4 mmol/L (ref 3.5–5.2)
Sodium: 139 mmol/L (ref 134–144)
Total Protein: 6.3 g/dL (ref 6.0–8.5)
eGFR: 94 mL/min/{1.73_m2} (ref >59)

## 2022-06-06 ENCOUNTER — Telehealth: Payer: Self-pay | Admitting: Rheumatology

## 2022-06-06 NOTE — Telephone Encounter (Unsigned)
Patient called stating she would like to apply for Euflexxa injections for bilateral knees.

## 2022-06-06 NOTE — Telephone Encounter (Signed)
Noted patient's appointment.  

## 2022-06-06 NOTE — Telephone Encounter (Signed)
Recommend updating x-rays of both knees at her upcoming follow-up visit on 06/21/2022--please make a note for Dr. Estanislado Pandy in the scheduling notes.   Okay to reapply for Euflexxa injections for both knees.

## 2022-06-07 NOTE — Telephone Encounter (Signed)
VOB submitted for Euflexxa, bilateral knees BV pending 

## 2022-06-07 NOTE — Telephone Encounter (Signed)
PA submitted for Euflexxa 

## 2022-06-07 NOTE — Progress Notes (Deleted)
Office Visit Note  Patient: Cathy Casey             Date of Birth: 11/28/1960           MRN: [id number]             PCP: Marco Collie, MD Referring: Marco Collie, MD Visit Date: 06/21/2022 Occupation: @GUAROCC @  Subjective:  No chief complaint on file.   History of Present Illness: [name] is a 62 y.o. female ***   Update XR of both knees for visco    Activities of Daily Living:  Patient reports morning stiffness for *** {minute/hour:19697}.   Patient {ACTIONS;DENIES/REPORTS:21021675::"Denies"} nocturnal pain.  Difficulty dressing/grooming: {ACTIONS;DENIES/REPORTS:21021675::"Denies"} Difficulty climbing stairs: {ACTIONS;DENIES/REPORTS:21021675::"Denies"} Difficulty getting out of chair: {ACTIONS;DENIES/REPORTS:21021675::"Denies"} Difficulty using hands for taps, buttons, cutlery, and/or writing: {ACTIONS;DENIES/REPORTS:21021675::"Denies"}  No Rheumatology ROS completed.   PMFS History:  Patient Active Problem List   Diagnosis Date Noted   Rheumatoid arthritis with rheumatoid factor of multiple sites without organ or systems involvement ([place]) 04/03/2016   High risk medications (not anticoagulants) long-term use 04/03/2016   Primary osteoarthritis of both knees 03/29/2016   Chronic pain of both knees 03/29/2016    Past Medical History:  Diagnosis Date   Overactive bladder    Rheumatoid arthritis ([place])     Family History  Problem Relation Age of Onset   Diabetes Mother    Rheum arthritis Sister    Diabetes Sister    Healthy Son    Healthy Son    Healthy Son    Past Surgical History:  Procedure Laterality Date   ABDOMINAL HYSTERECTOMY     CHOLECYSTECTOMY     Social History   Social History Narrative   Not on file   Immunization History  Administered Date(s) Administered   Moderna Sars-Covid-2 Vaccination 05/21/2019, 06/18/2019, 12/12/2019, 05/27/2020     Objective: Vital Signs: There were no vitals taken for this visit.   Physical Exam    Musculoskeletal Exam: ***  CDAI Exam: CDAI Score: -- Patient Global: --; Provider Global: -- Swollen: --; Tender: -- Joint Exam 06/21/2022   No joint exam has been documented for this visit   There is currently no information documented on the homunculus. Go to the Rheumatology activity and complete the homunculus joint exam.  Investigation: No additional findings.  Imaging: No results found.  Recent Labs: Lab Results  Component Value Date   WBC 6.5 04/10/2022   HGB 12.6 04/10/2022   PLT 271 04/10/2022   NA 139 04/10/2022   K 4.4 04/10/2022   CL 102 04/10/2022   CO2 24 04/10/2022   GLUCOSE 87 04/10/2022   BUN 11 04/10/2022   CREATININE 0.73 04/10/2022   BILITOT <0.2 04/10/2022   ALKPHOS 93 04/10/2022   AST 27 04/10/2022   ALT 25 04/10/2022   PROT 6.3 04/10/2022   ALBUMIN 4.1 04/10/2022   CALCIUM 9.1 04/10/2022   GFRAA 96 09/06/2020    Speciality Comments: No specialty comments available.  Procedures:  No procedures performed Allergies: Patient has no known allergies.   Assessment / Plan:     Visit Diagnoses: Rheumatoid arthritis with rheumatoid factor of multiple sites without organ or systems involvement (HCC)  High risk medication use  Primary osteoarthritis of both knees  Primary osteoarthritis of both feet  Plantar fasciitis  DDD (degenerative disc disease), lumbar  Trochanteric bursitis, left hip  Overactive bladder  Other fatigue  Orders: No orders of the defined types were placed in this encounter.  No orders of the defined  types were placed in this encounter.   Face-to-face time spent with patient was *** minutes. Greater than 50% of time was spent in counseling and coordination of care.  Follow-Up Instructions: No follow-ups on file.   Ofilia Neas, PA-C  Note - This record has been created using Dragon software.  Chart creation errors have been sought, but may not always  have been located. Such creation errors do not  reflect on  the standard of medical care.

## 2022-06-08 NOTE — Telephone Encounter (Signed)
Please call to schedule visco injections.  Approved for Euflexxa, Bilateral knee(s). [place] [id number]) and Administration [phone]) are covered at 100% of the allowable amount. $80 co-pay Deductible do not apply Auth [id number] YS Valid 06/08/2022 - 06/08/2023

## 2022-06-08 NOTE — Telephone Encounter (Signed)
Correction:  The prescription drug plan requires that this medication (Euflexxa) be filled through [facility].

## 2022-06-09 ENCOUNTER — Other Ambulatory Visit: Payer: Self-pay | Admitting: *Deleted

## 2022-06-13 ENCOUNTER — Other Ambulatory Visit: Payer: Self-pay | Admitting: *Deleted

## 2022-06-13 MED ORDER — EUFLEXXA 20 MG/2ML IX SOSY: PREFILLED_SYRINGE | INTRA_ARTICULAR | 0 refills | Status: DC

## 2022-06-19 NOTE — Telephone Encounter (Signed)
Patient set up an account with [facility] and was told the Euflexxa will be delivered to our office on 06/22/22.

## 2022-06-21 ENCOUNTER — Ambulatory Visit: Payer: BC Managed Care – PPO | Admitting: Rheumatology

## 2022-06-21 DIAGNOSIS — N3281 Overactive bladder: Secondary | ICD-10-CM

## 2022-06-21 DIAGNOSIS — M170 Bilateral primary osteoarthritis of knee: Secondary | ICD-10-CM

## 2022-06-21 DIAGNOSIS — Z79899 Other long term (current) drug therapy: Secondary | ICD-10-CM

## 2022-06-21 DIAGNOSIS — M5136 Other intervertebral disc degeneration, lumbar region: Secondary | ICD-10-CM

## 2022-06-21 DIAGNOSIS — M0579 Rheumatoid arthritis with rheumatoid factor of multiple sites without organ or systems involvement: Secondary | ICD-10-CM

## 2022-06-21 DIAGNOSIS — M722 Plantar fascial fibromatosis: Secondary | ICD-10-CM

## 2022-06-21 DIAGNOSIS — M19071 Primary osteoarthritis, right ankle and foot: Secondary | ICD-10-CM

## 2022-06-21 DIAGNOSIS — M7062 Trochanteric bursitis, left hip: Secondary | ICD-10-CM

## 2022-06-21 DIAGNOSIS — R5383 Other fatigue: Secondary | ICD-10-CM

## 2022-06-23 NOTE — Telephone Encounter (Signed)
Patient's Euflexxa is scheduled to be delivered to [address], [place] on Thursday, 06/29/22. Confirmed by Montel Culver at [facility] [phone] Phone #[phone]

## 2022-06-28 NOTE — Progress Notes (Unsigned)
Office Visit Note  Patient: Cathy Casey             Date of Birth: June 20, 1960           MRN: [id number]             PCP: Abner Greenspan, MD Referring: Abner Greenspan, MD Visit Date: 07/12/2022 Occupation: @  Subjective:  Pain in both knee joints   History of Present Illness: Cathy Casey is a 62 y.o. female with history of seronegative rheumatoid arthritis and osteoarthritis. She remains on Methotrexate 3 tablets by mouth once weekly and folic acid 1 mg p.o. daily.  She is tolerating methotrexate without any side effects and has not missed any doses recently.  She has having increased pain in both hands, knees, and feet. She denies any joint swelling. She has not been taking any OTC products for pain relief.  She has ongoing discomfort in both knee joints.  She has some difficulty climbing steps and rising from seated position.  Patient presents today for the first Euflexxa injection of the series for both knees.  Meloxicam     Activities of Daily Living:  Patient reports morning stiffness for all day. Patient Reports nocturnal pain.  Difficulty dressing/grooming: Denies Difficulty climbing stairs: Reports Difficulty getting out of chair: Reports Difficulty using hands for taps, buttons, cutlery, and/or writing: Reports  Review of Systems  Constitutional:  Positive for fatigue.  HENT:  Negative for mouth sores and mouth dryness.   Eyes:  Negative for dryness.  Respiratory:  Negative for shortness of breath.   Cardiovascular:  Negative for chest pain and palpitations.  Gastrointestinal:  Negative for blood in stool, constipation and diarrhea.  Endocrine: Negative for increased urination.  Genitourinary:  Negative for involuntary urination.  Musculoskeletal:  Positive for joint pain, joint pain and morning stiffness. Negative for gait problem, joint swelling, myalgias, muscle weakness, muscle tenderness and myalgias.  Skin:  Negative for color change, rash, hair loss and  sensitivity to sunlight.  Allergic/Immunologic: Negative for susceptible to infections.  Neurological:  Negative for dizziness and headaches.  Hematological:  Negative for swollen glands.  Psychiatric/Behavioral:  Positive for sleep disturbance. Negative for depressed mood. The patient is not nervous/anxious.     PMFS History:  Patient Active Problem List   Diagnosis Date Noted   Rheumatoid arthritis with rheumatoid factor of multiple sites without organ or systems involvement 04/03/2016   High risk medications (not anticoagulants) long-term use 04/03/2016   Primary osteoarthritis of both knees 03/29/2016   Chronic pain of both knees 03/29/2016    Past Medical History:  Diagnosis Date   Overactive bladder    Rheumatoid arthritis     Family History  Problem Relation Age of Onset   Diabetes Mother    Rheum arthritis Sister    Diabetes Sister    Healthy Son    Healthy Son    Healthy Son    Past Surgical History:  Procedure Laterality Date   ABDOMINAL HYSTERECTOMY     CHOLECYSTECTOMY     Social History   Social History Narrative   Not on file   Immunization History  Administered Date(s) Administered   Moderna Sars-Covid-2 Vaccination 05/21/2019, 06/18/2019, 12/12/2019, 05/27/2020     Objective: Vital Signs: BP 128/83 (BP Location: Left Arm, Patient Position: Sitting, Cuff Size: Normal)   Pulse 67   Resp 15   Ht  (1.6 m)   Wt 170 lb 12.8 oz (77.5 kg)   BMI 30.26 kg/m  Physical Exam Vitals and nursing note reviewed.  Constitutional:      Appearance: She is well-developed.  HENT:     Head: Normocephalic and atraumatic.  Eyes:     Conjunctiva/sclera: Conjunctivae normal.  Cardiovascular:     Rate and Rhythm: Normal rate and regular rhythm.     Heart sounds: Normal heart sounds.  Pulmonary:     Effort: Pulmonary effort is normal.     Breath sounds: Normal breath sounds.  Abdominal:     General: Bowel sounds are normal.     Palpations: Abdomen is  soft.  Musculoskeletal:     Cervical back: Normal range of motion.  Lymphadenopathy:     Cervical: No cervical adenopathy.  Skin:    General: Skin is warm and dry.     Capillary Refill: Capillary refill takes less than 2 seconds.  Neurological:     Mental Status: She is alert and oriented to person, place, and time.  Psychiatric:        Behavior: Behavior normal.      Musculoskeletal Exam: C-spine, thoracic spine, lumbar spine have good range of motion.  Shoulder joints, elbow joints, wrist joints, MCPs, PIPs, DIPs have good range of motion with no synovitis.  Tenderness over bilateral MCP joints.  Tenderness over bilateral first and second MCPs and first PIP joints.  Complete fist formation bilaterally.  Hip joints have good range of motion with no groin pain.  Discomfort with range of motion of both knee joints.  No warmth or effusion noted.  Ankle joints have good range of motion with no tenderness or joint swelling.  No tenderness or synovitis over MTP joints.  Some tenderness along the midfoot bilaterally.  Some tenderness along the plantar fascia bilaterally.  CDAI Exam: CDAI Score: 9.2  Patient Global: 7 mm; Provider Global: 5 mm Swollen: 0 ; Tender: 12  Joint Exam 07/12/2022      Right  Left  CMC   Tender   Tender  MCP 1   Tender   Tender  MCP 2   Tender   Tender  IP   Tender   Tender  Knee   Tender   Tender  Tarsometatarsal   Tender   Tender     Investigation: No additional findings.  Imaging: No results found.  Recent Labs: Lab Results  Component Value Date   WBC 6.5 04/10/2022   HGB 12.6 04/10/2022   PLT 271 04/10/2022   NA 139 04/10/2022   K 4.4 04/10/2022   CL 102 04/10/2022   CO2 24 04/10/2022   GLUCOSE 87 04/10/2022   BUN 11 04/10/2022   CREATININE 0.73 04/10/2022   BILITOT <0.2 04/10/2022   ALKPHOS 93 04/10/2022   AST 27 04/10/2022   ALT 25 04/10/2022   PROT 6.3 04/10/2022   ALBUMIN 4.1 04/10/2022   CALCIUM 9.1 04/10/2022   GFRAA 96  09/06/2020    Speciality Comments: No specialty comments available.  Procedures:  Large Joint Inj: bilateral knee on 07/12/2022 2:59 PM Indications: pain Details: 27 G 1.5 in needle, medial approach  Arthrogram: No  Medications (Right): 1.5 mL lidocaine 1 %; 20 mg Sodium Hyaluronate (Viscosup) 20 MG/2ML Aspirate (Right): 0 mL Medications (Left): 1.5 mL lidocaine 1 %; 20 mg Sodium Hyaluronate (Viscosup) 20 MG/2ML Aspirate (Left): 0 mL Outcome: tolerated well, no immediate complications Procedure, treatment alternatives, risks and benefits explained, specific risks discussed. Consent was given by the patient. Immediately prior to procedure a time out was called to verify the correct  patient, procedure, equipment, support staff and site/side marked as required. Patient was prepped and draped in the usual sterile fashion.     Allergies: Patient has no known allergies.      Assessment / Plan:     Visit Diagnoses: Rheumatoid arthritis with rheumatoid factor of multiple sites without organ or systems involvement -patient presents today experiencing increased pain involving multiple joints.  Over the past 3 months she has had increased pain in both hands, both knees, and both feet.  She has not noticed any joint swelling.  She has tried taking meloxicam as needed for pain relief.  She remains on methotrexate 3 tablets by mouth once weekly.  She is tolerating methotrexate without any side effects and has not missed any doses recently.  She has been unable to identify a trigger for her symptoms other than having increased weight gain since discontinuing Ozempic.  She presents today for bilateral knee joint Euflexxa injections which will hopefully help to alleviate the discomfort and stiffness in her knees.  A sed rate will be checked today to assess for inflammation since no obvious synovitis was noted on exam.  If her sed rate is within normal limits and she continues to have persistent pain  involving multiple joints we plan on proceeding with an ultrasound of both hands to assess for synovitis.  She is apprehensive to increase the dose of methotrexate at this time.  She will follow up in in the office in 3 months or sooner if needed. Plan: Sedimentation rate  High risk medication use - Methotrexate 3 tablets by mouth once weekly and folic acid 1 mg p.o. daily.  CBC and CMP WNL on 04/10/22.  Orders for CBC and CMP released today.  Her next lab work will be due in July and every 3 months to monitor for toxicity.  Standing orders for CBC and CMP remain in place. No recent or recurrent infections.  Discussed the importance of holding methotrexate if she develops signs or symptoms of an infection and to resume once the infection has completely cleared.  - Plan: CBC with Differential/Platelet, COMPLETE METABOLIC PANEL WITH GFR  Primary osteoarthritis of both knees - 01/04/ 2023 x-ray of the right knee showed moderate osteoarthritis and moderate chondromalacia patella. She had viscosupplement injections in June 2022. Patient presents today with ongoing pain and stiffness in both knee joints.  She had viscosupplementation performed in both knees in June 2022 which provided significant relief.  Patient presents today for the first Euflexxa injection of the series for both knees.  She tolerated the procedures well.  Procedure notes were completed above.  Aftercare was discussed.  - Plan: XR KNEE 3 VIEW RIGHT, XR KNEE 3 VIEW LEFT, Large Joint Inj: bilateral knee  Chronic pain of both knees - Patient presents today with increased joint pain and stiffness in both knee joints.  She is having difficulty rising from a seated position as well as climbing steps.  She presented today for the first Euflexxa injection of the series for both knees.  X-rays of both knees were updated today to assess for radiographic progression.  Plan: XR KNEE 3 VIEW RIGHT, XR KNEE 3 VIEW LEFT  Primary osteoarthritis of both  feet: She has been experiencing increased discomfort in both feet especially in the midfoot.  On examination no tenderness or synovitis over the MTP joints noted.  Ankle joints have good range of motion with no tenderness or synovitis.  Plan to check sed rate today.  Plantar fasciitis: She  has been experiencing intermittent discomfort due to plantar fasciitis of both feet.  DDD (degenerative disc disease), lumbar: No midline spinal tenderness at this time.  Trochanteric bursitis, left hip: Intermittent discomfort.  No tenderness upon palpation today.  Other medical conditions are listed as follows:  Overactive bladder  Other fatigue    Orders: Orders Placed This Encounter  Procedures   Large Joint Inj: bilateral knee   XR KNEE 3 VIEW RIGHT   XR KNEE 3 VIEW LEFT   CBC with Differential/Platelet   COMPLETE METABOLIC PANEL WITH GFR   Sedimentation rate   No orders of the defined types were placed in this encounter.    Follow-Up Instructions: Return in about 3 months (around 10/11/2022) for Rheumatoid arthritis, Osteoarthritis.   Gearldine Bienenstock, PA-C  Note - This record has been created using Dragon software.  Chart creation errors have been sought, but may not always  have been located. Such creation errors do not reflect on  the standard of medical care.

## 2022-07-03 NOTE — Telephone Encounter (Signed)
Eulfexxa was delivered from [facility]. Patient scheduled for April 24th to receive first injection of the series.

## 2022-07-12 ENCOUNTER — Ambulatory Visit: Payer: BC Managed Care – PPO | Attending: Rheumatology | Admitting: Physician Assistant

## 2022-07-12 ENCOUNTER — Encounter: Payer: Self-pay | Admitting: Physician Assistant

## 2022-07-12 ENCOUNTER — Ambulatory Visit: Payer: BC Managed Care – PPO

## 2022-07-12 VITALS — BP 128/83 | HR 67 | Resp 15 | Ht 63.0 in | Wt 170.8 lb

## 2022-07-12 DIAGNOSIS — M19072 Primary osteoarthritis, left ankle and foot: Secondary | ICD-10-CM

## 2022-07-12 DIAGNOSIS — M25561 Pain in right knee: Secondary | ICD-10-CM

## 2022-07-12 DIAGNOSIS — M19071 Primary osteoarthritis, right ankle and foot: Secondary | ICD-10-CM

## 2022-07-12 DIAGNOSIS — N3281 Overactive bladder: Secondary | ICD-10-CM

## 2022-07-12 DIAGNOSIS — Z79899 Other long term (current) drug therapy: Secondary | ICD-10-CM

## 2022-07-12 DIAGNOSIS — M0579 Rheumatoid arthritis with rheumatoid factor of multiple sites without organ or systems involvement: Secondary | ICD-10-CM | POA: Diagnosis not present

## 2022-07-12 DIAGNOSIS — M5136 Other intervertebral disc degeneration, lumbar region: Secondary | ICD-10-CM

## 2022-07-12 DIAGNOSIS — M7062 Trochanteric bursitis, left hip: Secondary | ICD-10-CM

## 2022-07-12 DIAGNOSIS — R5383 Other fatigue: Secondary | ICD-10-CM

## 2022-07-12 DIAGNOSIS — M722 Plantar fascial fibromatosis: Secondary | ICD-10-CM

## 2022-07-12 DIAGNOSIS — M25562 Pain in left knee: Secondary | ICD-10-CM

## 2022-07-12 DIAGNOSIS — G8929 Other chronic pain: Secondary | ICD-10-CM

## 2022-07-12 DIAGNOSIS — M170 Bilateral primary osteoarthritis of knee: Secondary | ICD-10-CM | POA: Diagnosis not present

## 2022-07-12 MED ORDER — LIDOCAINE HCL 1 % IJ SOLN
1.5000 mL | INTRAMUSCULAR | Status: AC | PRN
Start: 2022-07-12 — End: 2022-07-12
  Administered 2022-07-12: 1.5 mL

## 2022-07-12 MED ORDER — SODIUM HYALURONATE (VISCOSUP) 20 MG/2ML IX SOSY
20.0000 mg | PREFILLED_SYRINGE | INTRA_ARTICULAR | Status: AC | PRN
Start: 2022-07-12 — End: 2022-07-12
  Administered 2022-07-12: 20 mg via INTRA_ARTICULAR

## 2022-07-12 NOTE — Patient Instructions (Signed)
Standing Labs We placed an order today for your standing lab work.   Please have your standing labs drawn in July and every 3 months   Please have your labs drawn 2 weeks prior to your appointment so that the provider can discuss your lab results at your appointment, if possible.  Please note that you may see your imaging and lab results in MyChart before we have reviewed them. We will contact you once all results are reviewed. Please allow our office up to 72 hours to thoroughly review all of the results before contacting the office for clarification of your results.  WALK-IN LAB HOURS  Monday through Thursday from 8:00 am -12:30 pm and 1:00 pm-5:00 pm and Friday from 8:00 am-12:00 pm.  Patients with office visits requiring labs will be seen before walk-in labs.  You may encounter longer than normal wait times. Please allow additional time. Wait times may be shorter on  Monday and Thursday afternoons.  We do not book appointments for walk-in labs. We appreciate your patience and understanding with our staff.   Labs are drawn by [facility]. Please bring your co-pay at the time of your lab draw.  You may receive a bill from [facility] for your lab work.  Please note if you are on Hydroxychloroquine and and an order has been placed for a Hydroxychloroquine level,  you will need to have it drawn 4 hours or more after your last dose.  If you wish to have your labs drawn at another location, please call the office 24 hours in advance so we can fax the orders.  The office is located at [address], [address], [place], [place] [address]   If you have any questions regarding directions or hours of operation,  please call [phone].   As a reminder, please drink plenty of water prior to coming for your lab work. Thanks!  

## 2022-07-13 LAB — COMPLETE METABOLIC PANEL WITH GFR
AG Ratio: 1.7 (calc) (ref 1.0–2.5)
ALT: 23 U/L (ref 6–29)
AST: 22 U/L (ref 10–35)
Albumin: 3.9 g/dL (ref 3.6–5.1)
Alkaline phosphatase (APISO): 86 U/L (ref 37–153)
BUN: 13 mg/dL (ref 7–25)
CO2: 28 mmol/L (ref 20–32)
Calcium: 9.2 mg/dL (ref 8.6–10.4)
Chloride: 108 mmol/L (ref 98–110)
Creat: 0.97 mg/dL (ref 0.50–1.05)
Globulin: 2.3 g/dL (calc) (ref 1.9–3.7)
Glucose, Bld: 100 mg/dL — ABNORMAL HIGH (ref 65–99)
Potassium: 4.6 mmol/L (ref 3.5–5.3)
Sodium: 144 mmol/L (ref 135–146)
Total Bilirubin: 0.4 mg/dL (ref 0.2–1.2)
Total Protein: 6.2 g/dL (ref 6.1–8.1)
eGFR: 66 mL/min/{1.73_m2} (ref >=60)

## 2022-07-13 LAB — CBC WITH DIFFERENTIAL/PLATELET
Absolute Monocytes: 897 cells/uL (ref 200–950)
Basophils Absolute: 68 cells/uL (ref 0–200)
Basophils Relative: 0.9 %
Eosinophils Absolute: 182 cells/uL (ref 15–500)
Eosinophils Relative: 2.4 %
HCT: 39.9 % (ref 35.0–45.0)
Hemoglobin: 13 g/dL (ref 11.7–15.5)
Lymphs Abs: 2409 cells/uL (ref 850–3900)
MCH: 29.4 pg (ref 27.0–33.0)
MCHC: 32.6 g/dL (ref 32.0–36.0)
MCV: 90.3 fL (ref 80.0–100.0)
MPV: 10.9 fL (ref 7.5–12.5)
Monocytes Relative: 11.8 %
Neutro Abs: 4043 cells/uL (ref 1500–7800)
Neutrophils Relative %: 53.2 %
Platelets: 313 10*3/uL (ref 140–400)
RBC: 4.42 10*6/uL (ref 3.80–5.10)
RDW: 12.4 % (ref 11.0–15.0)
Total Lymphocyte: 31.7 %
WBC: 7.6 10*3/uL (ref 3.8–10.8)

## 2022-07-13 LAB — SEDIMENTATION RATE: Sed Rate: 6 mm/h (ref 0–30)

## 2022-07-13 NOTE — Progress Notes (Signed)
CBC and CMP WNL.  ESR WNL.    Please see if we can combine an ultrasound visit with one of her euflexxa injection visits?  She will be coming from [place] and would like to have an ultrasound of both hands

## 2022-07-16 ENCOUNTER — Other Ambulatory Visit: Payer: Self-pay | Admitting: Physician Assistant

## 2022-07-17 NOTE — Telephone Encounter (Signed)
Last Fill: 04/10/2022  Labs: 07/12/2022 CBC and CMP WNL. ESR WNL  Next Visit: 10/17/2022  Last Visit: 07/12/2022  DX: Rheumatoid arthritis with rheumatoid factor of multiple sites without organ or systems involvement   Current Dose per office note on 07/12/2022: Methotrexate 3 tablets by mouth once weekly   Okay to refill Methotrexate?

## 2022-07-19 ENCOUNTER — Ambulatory Visit: Payer: BC Managed Care – PPO | Attending: Physician Assistant | Admitting: Physician Assistant

## 2022-07-19 DIAGNOSIS — M170 Bilateral primary osteoarthritis of knee: Secondary | ICD-10-CM

## 2022-07-19 MED ORDER — LIDOCAINE HCL 1 % IJ SOLN
1.5000 mL | INTRAMUSCULAR | Status: AC | PRN
Start: 2022-07-19 — End: 2022-07-19
  Administered 2022-07-19: 1.5 mL

## 2022-07-19 MED ORDER — SODIUM HYALURONATE (VISCOSUP) 20 MG/2ML IX SOSY
20.0000 mg | PREFILLED_SYRINGE | INTRA_ARTICULAR | Status: AC | PRN
Start: 2022-07-19 — End: 2022-07-19
  Administered 2022-07-19: 20 mg via INTRA_ARTICULAR

## 2022-07-19 NOTE — Progress Notes (Signed)
   Procedure Note  Patient: Cathy Casey             Date of Birth: 1960-10-17           MRN: [id number]             Visit Date: 07/19/2022  Procedures: Visit Diagnoses:  1. Primary osteoarthritis of both knees    Euflexxa #2 bilateral knees, P/P Large Joint Inj: bilateral knee on 07/19/2022 3:12 PM Indications: pain Details: 27 G 1.5 in needle, medial approach  Arthrogram: No  Medications (Right): 1.5 mL lidocaine 1 %; 20 mg Sodium Hyaluronate (Viscosup) 20 MG/2ML Aspirate (Right): 0 mL Medications (Left): 1.5 mL lidocaine 1 %; 20 mg Sodium Hyaluronate (Viscosup) 20 MG/2ML Aspirate (Left): 0 mL Outcome: tolerated well, no immediate complications Procedure, treatment alternatives, risks and benefits explained, specific risks discussed. Consent was given by the patient. Immediately prior to procedure a time out was called to verify the correct patient, procedure, equipment, support staff and site/side marked as required. Patient was prepped and draped in the usual sterile fashion.     Patient tolerated the procedures well.  Procedure notes were completed above.  Aftercare was discussed. Sherron Ales, PA-C

## 2022-07-26 ENCOUNTER — Ambulatory Visit (INDEPENDENT_AMBULATORY_CARE_PROVIDER_SITE_OTHER): Payer: BC Managed Care – PPO | Admitting: Rheumatology

## 2022-07-26 ENCOUNTER — Ambulatory Visit: Payer: BC Managed Care – PPO | Attending: Physician Assistant | Admitting: Physician Assistant

## 2022-07-26 ENCOUNTER — Ambulatory Visit: Payer: BC Managed Care – PPO

## 2022-07-26 DIAGNOSIS — M170 Bilateral primary osteoarthritis of knee: Secondary | ICD-10-CM

## 2022-07-26 DIAGNOSIS — M79642 Pain in left hand: Secondary | ICD-10-CM | POA: Diagnosis not present

## 2022-07-26 DIAGNOSIS — M79641 Pain in right hand: Secondary | ICD-10-CM | POA: Diagnosis not present

## 2022-07-26 MED ORDER — SODIUM HYALURONATE (VISCOSUP) 20 MG/2ML IX SOSY
20.0000 mg | PREFILLED_SYRINGE | INTRA_ARTICULAR | Status: AC | PRN
Start: 2022-07-26 — End: 2022-07-26
  Administered 2022-07-26: 20 mg via INTRA_ARTICULAR

## 2022-07-26 MED ORDER — LIDOCAINE HCL 1 % IJ SOLN
1.5000 mL | INTRAMUSCULAR | Status: AC | PRN
Start: 2022-07-26 — End: 2022-07-26
  Administered 2022-07-26: 1.5 mL

## 2022-07-26 NOTE — Progress Notes (Deleted)
Office Visit Note  Patient: Cathy Casey             Date of Birth: [birth year]           MRN: [id number]             PCP: Abner Greenspan, MD Referring: Abner Greenspan, MD Visit Date: 08/03/2022 Occupation: @GUAROCC @  Subjective:  No chief complaint on file.   History of Present Illness: Cathy Casey is a 61 y.o. female ***     Activities of Daily Living:  Patient reports morning stiffness for *** {minute/hour:19697}.   Patient {ACTIONS;DENIES/REPORTS:21021675::"Denies"} nocturnal pain.  Difficulty dressing/grooming: {ACTIONS;DENIES/REPORTS:21021675::"Denies"} Difficulty climbing stairs: {ACTIONS;DENIES/REPORTS:21021675::"Denies"} Difficulty getting out of chair: {ACTIONS;DENIES/REPORTS:21021675::"Denies"} Difficulty using hands for taps, buttons, cutlery, and/or writing: {ACTIONS;DENIES/REPORTS:21021675::"Denies"}  No Rheumatology ROS completed.   PMFS History:  Patient Active Problem List   Diagnosis Date Noted   Rheumatoid arthritis with rheumatoid factor of multiple sites without organ or systems involvement (HCC) 04/03/2016   High risk medications (not anticoagulants) long-term use 04/03/2016   Primary osteoarthritis of both knees 03/29/2016   Chronic pain of both knees 03/29/2016    Past Medical History:  Diagnosis Date   Overactive bladder    Rheumatoid arthritis (HCC)     Family History  Problem Relation Age of Onset   Diabetes Mother    Rheum arthritis Sister    Diabetes Sister    Healthy Son    Healthy Son    Healthy Son    Past Surgical History:  Procedure Laterality Date   ABDOMINAL HYSTERECTOMY     CHOLECYSTECTOMY     Social History   Social History Narrative   Not on file   Immunization History  Administered Date(s) Administered   Moderna Sars-Covid-2 Vaccination 05/21/2019, 06/18/2019, 12/12/2019, 05/27/2020     Objective: Vital Signs: There were no vitals taken for this visit.   Physical Exam   Musculoskeletal Exam: ***  CDAI  Exam: CDAI Score: -- Patient Global: --; Provider Global: -- Swollen: --; Tender: -- Joint Exam 08/03/2022   No joint exam has been documented for this visit   There is currently no information documented on the homunculus. Go to the Rheumatology activity and complete the homunculus joint exam.  Investigation: No additional findings.  Imaging: No results found.  Recent Labs: Lab Results  Component Value Date   WBC 7.6 07/12/2022   HGB 13.0 07/12/2022   PLT 313 07/12/2022   NA 144 07/12/2022   K 4.6 07/12/2022   CL 108 07/12/2022   CO2 28 07/12/2022   GLUCOSE 100 (H) 07/12/2022   BUN 13 07/12/2022   CREATININE 0.97 07/12/2022   BILITOT 0.4 07/12/2022   ALKPHOS 93 04/10/2022   AST 22 07/12/2022   ALT 23 07/12/2022   PROT 6.2 07/12/2022   ALBUMIN 4.1 04/10/2022   CALCIUM 9.2 07/12/2022   GFRAA 96 09/06/2020    Speciality Comments: No specialty comments available.  Procedures:  No procedures performed Allergies: Patient has no known allergies.   Assessment / Plan:     Visit Diagnoses: No diagnosis found.  Orders: No orders of the defined types were placed in this encounter.  No orders of the defined types were placed in this encounter.   Face-to-face time spent with patient was *** minutes. Greater than 50% of time was spent in counseling and coordination of care.  Follow-Up Instructions: No follow-ups on file.   Ellen Henri, CMA  Note - This record has been created using Animal nutritionist.  Chart  creation errors have been sought, but may not always  have been located. Such creation errors do not reflect on  the standard of medical care.

## 2022-07-26 NOTE — Progress Notes (Signed)
   Procedure Note  Patient: Cathy Casey             Date of Birth: November 18, 1960           MRN: [id number]             Visit Date: 07/26/2022  Procedures: Visit Diagnoses:  1. Primary osteoarthritis of both knees    Efulexxa #3 bilateral knees, P/P Large Joint Inj: bilateral knee on 07/26/2022 2:54 PM Indications: pain Details: 27 G 1.5 in needle, medial approach  Arthrogram: No  Medications (Right): 1.5 mL lidocaine 1 %; 20 mg Sodium Hyaluronate (Viscosup) 20 MG/2ML Aspirate (Right): 0 mL Medications (Left): 1.5 mL lidocaine 1 %; 20 mg Sodium Hyaluronate (Viscosup) 20 MG/2ML Aspirate (Left): 0 mL Outcome: tolerated well, no immediate complications Procedure, treatment alternatives, risks and benefits explained, specific risks discussed. Consent was given by the patient. Immediately prior to procedure a time out was called to verify the correct patient, procedure, equipment, support staff and site/side marked as required. Patient was prepped and draped in the usual sterile fashion.    Patient tolerated the procedure well.  Aftercare was discussed.  Sherron Ales, PA-C

## 2022-07-26 NOTE — Progress Notes (Signed)
Patient was here to get ultrasound examination of bilateral hands due to ongoing pain and discomfort.  Ultrasound examination of bilateral hands was performed per EULAR recommendations. Using 15 MHz transducer, grayscale and power Doppler bilateral second, and third MCP joints and bilateral wrist joints both dorsal and volar aspects were evaluated to look for synovitis or tenosynovitis. The findings were there was mild synovitis over bilateral second and third MCP joints on ultrasound examination.   Impression: Ultrasound examination showed mild synovitis and bilateral second and third MCP joints.  Ultrasound findings were discussed with the patient.  Pollyann Savoy, MD

## 2022-07-27 ENCOUNTER — Telehealth: Payer: Self-pay | Admitting: Physician Assistant

## 2022-07-27 ENCOUNTER — Other Ambulatory Visit: Payer: Self-pay

## 2022-07-27 MED ORDER — METHOTREXATE SODIUM 2.5 MG PO TABS: ORAL_TABLET | ORAL | 0 refills | Status: DC

## 2022-07-27 NOTE — Progress Notes (Unsigned)
Please review and sign MTX prescription to reflect new dose that you discussed with the patient. Thanks!

## 2022-07-27 NOTE — Telephone Encounter (Signed)
I called the patient to review ultrasound results from yesterday.  She has mild synovitis involving bilateral second and third MCP joints.  Plan to increase the dose of methotrexate from 3 tablets once weekly to 5 tablets once weekly.  A new prescription for methotrexate will be sent to the pharmacy today.  She will remain on folic acid 1 mg daily.  She will have updated lab work including CBC and CMP in 2 to 3 weeks for medication monitoring.  She will call us prior to having lab work closer to home to have orders released.  She was advised to notify us if she cannot tolerate taking the increased dose of methotrexate.  She will also notify us if she continues to have ongoing pain and inflammation.  Plan to cancel follow-up visits scheduled on 08/03/2022 but she will keep her follow-up visit scheduled on 10/17/2022 for [place] to assess the efficacy of the increased dose of methotrexate.  All questions were addressed.  Sherron Ales, PA-C

## 2022-07-28 ENCOUNTER — Telehealth: Payer: Self-pay | Admitting: *Deleted

## 2022-07-28 NOTE — Telephone Encounter (Signed)
Patient contacted the office and states she is having trouble filling her methotrexate. Patient states the pharmacy is advising her it is too early to refill. Patient states she picked up a prescription for about a week ago but her dose has increased. Advised patient that she should have enough medication for a few weeks worth of the new dose. Patient advised to contact the pharmacy once she runs low on her medication as they should be able to fill it at that time. Patient expressed understanding.

## 2022-08-03 ENCOUNTER — Ambulatory Visit: Payer: BC Managed Care – PPO | Admitting: Physician Assistant

## 2022-08-03 DIAGNOSIS — M722 Plantar fascial fibromatosis: Secondary | ICD-10-CM

## 2022-08-03 DIAGNOSIS — N3281 Overactive bladder: Secondary | ICD-10-CM

## 2022-08-03 DIAGNOSIS — M7062 Trochanteric bursitis, left hip: Secondary | ICD-10-CM

## 2022-08-03 DIAGNOSIS — M0579 Rheumatoid arthritis with rheumatoid factor of multiple sites without organ or systems involvement: Secondary | ICD-10-CM

## 2022-08-03 DIAGNOSIS — M19071 Primary osteoarthritis, right ankle and foot: Secondary | ICD-10-CM

## 2022-08-03 DIAGNOSIS — G8929 Other chronic pain: Secondary | ICD-10-CM

## 2022-08-03 DIAGNOSIS — Z79899 Other long term (current) drug therapy: Secondary | ICD-10-CM

## 2022-08-03 DIAGNOSIS — M170 Bilateral primary osteoarthritis of knee: Secondary | ICD-10-CM

## 2022-08-03 DIAGNOSIS — R5383 Other fatigue: Secondary | ICD-10-CM

## 2022-08-03 DIAGNOSIS — M5136 Other intervertebral disc degeneration, lumbar region: Secondary | ICD-10-CM

## 2022-08-17 ENCOUNTER — Other Ambulatory Visit: Payer: Self-pay | Admitting: Physician Assistant

## 2022-08-17 NOTE — Telephone Encounter (Signed)
Last Fill: 05/30/2021  Next Visit: 10/17/2022  Last Visit: 07/12/2022  DX: Rheumatoid arthritis with rheumatoid factor of multiple sites without organ or systems involvement   Current Dose per office note on 07/12/2022: folic acid 1 mg p.o. daily.   Okay to refill folic acid?

## 2022-10-03 NOTE — Progress Notes (Unsigned)
Office Visit Note  Patient: Cathy Casey             Date of Birth: 10-Jan-1961           MRN: [id number]             PCP: Abner Greenspan, MD Referring: Abner Greenspan, MD Visit Date: 10/17/2022 Occupation: @GUAROCC @  Subjective:  Medication monitoring   History of Present Illness: Cathy Casey is a 62 y.o. female with history of seropositive rheumatoid arthritis and osteoarthritis.  Patient remains on Methotrexate 5 tablets by mouth once weekly and folic acid 1 mg p.o. daily.  Patient has been tolerating the increased dose of methotrexate without any side effects.  She states she is no longer taking meloxicam and takes over-the-counter NSAIDs very sparingly.  Patient is aware that her renal function was abnormal with her recent lab work on 10/09/2022.  She has been trying to increase her oral hydration and has been avoiding NSAIDs since then.  She is planning on having updated lab work in a few weeks. Patient states she has noticed minimal improvement in her hands since increasing the dose of methotrexate 3 months ago.  She continues to have constant pain in both knee joints.  She did not notice any improvement after undergoing Visco supplementation in both knees in April/May 2024.  Her knee joint pain has been a 6 out of 10 on a daily basis. She denies any recent or recurrent infections.    Activities of Daily Living:  Patient reports morning stiffness for 1 hour.   Patient Reports nocturnal pain.  Difficulty dressing/grooming: Denies Difficulty climbing stairs: Reports Difficulty getting out of chair: Reports Difficulty using hands for taps, buttons, cutlery, and/or writing: Reports  Review of Systems  Constitutional:  Positive for fatigue.  HENT:  Negative for mouth sores and mouth dryness.   Eyes:  Negative for dryness.  Respiratory:  Negative for shortness of breath.   Cardiovascular:  Negative for chest pain and palpitations.  Gastrointestinal:  Negative for blood in stool,  constipation and diarrhea.  Endocrine: Negative for increased urination.  Genitourinary:  Positive for involuntary urination.  Musculoskeletal:  Positive for joint pain, joint pain and morning stiffness. Negative for gait problem, joint swelling, myalgias, muscle weakness, muscle tenderness and myalgias.  Skin:  Negative for color change, rash, hair loss and sensitivity to sunlight.  Allergic/Immunologic: Negative for susceptible to infections.  Neurological:  Negative for dizziness and headaches.  Hematological:  Negative for swollen glands.  Psychiatric/Behavioral:  Positive for sleep disturbance. Negative for depressed mood. The patient is not nervous/anxious.     PMFS History:  Patient Active Problem List   Diagnosis Date Noted   Rheumatoid arthritis with rheumatoid factor of multiple sites without organ or systems involvement (HCC) 04/03/2016   High risk medications (not anticoagulants) long-term use 04/03/2016   Primary osteoarthritis of both knees 03/29/2016   Chronic pain of both knees 03/29/2016    Past Medical History:  Diagnosis Date   Overactive bladder    Rheumatoid arthritis (HCC)     Family History  Problem Relation Age of Onset   Diabetes Mother    Rheum arthritis Sister    Diabetes Sister    Healthy Son    Healthy Son    Healthy Son    Past Surgical History:  Procedure Laterality Date   ABDOMINAL HYSTERECTOMY     CHOLECYSTECTOMY     Social History   Social History Narrative   Not on file  Immunization History  Administered Date(s) Administered   [employer] Vaccination 05/21/2019, 06/18/2019, 12/12/2019, 05/27/2020     Objective: Vital Signs: BP 118/73 (BP Location: Left Arm, Patient Position: Sitting, Cuff Size: Normal)   Pulse 61   Resp 16   Ht 5\' 3"  (1.6 m)   Wt 178 lb 6.4 oz (80.9 kg)   BMI 31.60 kg/m    Physical Exam Vitals and nursing note reviewed.  Constitutional:      Appearance: She is well-developed.  HENT:     Head:  Normocephalic and atraumatic.  Eyes:     Conjunctiva/sclera: Conjunctivae normal.  Cardiovascular:     Rate and Rhythm: Normal rate and regular rhythm.     Heart sounds: Normal heart sounds.  Pulmonary:     Effort: Pulmonary effort is normal.     Breath sounds: Normal breath sounds.  Abdominal:     General: Bowel sounds are normal.     Palpations: Abdomen is soft.  Musculoskeletal:     Cervical back: Normal range of motion.  Lymphadenopathy:     Cervical: No cervical adenopathy.  Skin:    General: Skin is warm and dry.     Capillary Refill: Capillary refill takes less than 2 seconds.  Neurological:     Mental Status: She is alert and oriented to person, place, and time.  Psychiatric:        Behavior: Behavior normal.      Musculoskeletal Exam: C-spine, thoracic spine, and lumbar  spine good ROM.  Left trapezius muscle tension and tenderness.  Shoulder joints, elbow joints, wrist joints, MCPs, PIPs, and DIPs good ROM with no synovitis.  Tenderness of all MCP and PIP joints.  Hip joints have good ROM with no discomfort.  Painful ROM of both knees.  Warmth in both knees noted.  Tenderness of the left ankle.  Tenderness of all MTP joints.   CDAI Exam: CDAI Score: 32  Patient Global: 50 / 100; Provider Global: 50 / 100 Swollen: 0 ; Tender: 33  Joint Exam 10/17/2022      Right  Left  MCP 1   Tender   Tender  MCP 2   Tender   Tender  MCP 3   Tender   Tender  MCP 4   Tender   Tender  MCP 5   Tender   Tender  IP (thumb)   Tender   Tender  PIP 2 (finger)   Tender   Tender  PIP 3 (finger)   Tender   Tender  PIP 4 (finger)   Tender   Tender  PIP 5 (finger)   Tender   Tender  Knee   Tender   Tender  Ankle   Tender     MTP 1   Tender   Tender  MTP 2   Tender   Tender  MTP 3   Tender   Tender  MTP 4   Tender   Tender  MTP 5   Tender   Tender     Investigation: No additional findings.  Imaging: No results found.  Recent Labs: Lab Results  Component Value Date   WBC 6.7  10/09/2022   HGB 12.2 10/09/2022   PLT 272 10/09/2022   NA 141 10/09/2022   K 4.4 10/09/2022   CL 106 10/09/2022   CO2 21 10/09/2022   GLUCOSE 82 10/09/2022   BUN 12 10/09/2022   CREATININE 1.28 (H) 10/09/2022   BILITOT 0.3 10/09/2022   ALKPHOS 93 10/09/2022   AST 24  10/09/2022   ALT 18 10/09/2022   PROT 6.1 10/09/2022   ALBUMIN 4.0 10/09/2022   CALCIUM 9.1 10/09/2022   GFRAA 96 09/06/2020    Speciality Comments: No specialty comments available.  Procedures:  No procedures performed Allergies: Patient has no known allergies.    Assessment / Plan:     Visit Diagnoses: Rheumatoid arthritis with rheumatoid factor of multiple sites without organ or systems involvement [facility]): Patient presents today with ongoing pain involving both hands and both knee joints.  She has not noticed any clinical improvement since increasing the dose of methotrexate from 3 tablets to 5 tablets once weekly at her last office visit on 07/12/2022.  She has been tolerating the increased dose of methotrexate without any side effects or recent or recurrent infections.  Her knee joint pain has been constant and has been a 6 out of 10 daily.  She has ongoing tenderness over all MCP and PIP joints, warmth in both knees, and tenderness in all MTP joints.  Her symptoms are inadequately controlled despite increasing the dose of methotrexate so medication change will need to be made.  Unfortunately we are unable to increase the dose of methotrexate further at this time due to renal function.  She has discontinued meloxicam and has been trying to avoid over-the-counter NSAID use.  Lab work from 10/09/2022 was reviewed with the patient today in the office: Creatinine was 1.28 and GFR was 47.  Plan on updating lab work in 2 weeks to reassess.  Different treatment options were discussed today in case her renal function does not improve including switching to Arava or Enbrel.  She was given informational handouts about both  medications to review.  If her renal function has returned to within normal limits and she continues to avoid the use of NSAIDs we could discuss further increasing the dose of methotrexate with close lab monitoring.  Different treatment options will be discussed again in detail pending lab results. No medication changes will be made at this time.  High risk medication use - Methotrexate 5 tablets by mouth once weekly and folic acid 1 mg p.o. daily. CBC and CMP updated on 10/09/22.  She will return in 2 weeks to update BMP with GFR Advised the patient to avoid all NSAID use. No recent or recurrent infections.  Discussed the importance of holding methotrexate if she develops signs or symptoms of an infection and to resume once the infection has completely cleared.  Primary osteoarthritis of both knees - S/p euflexxa bilateral knees April/May 2024-Patient presents today with ongoing pain in both knee joints.  Her discomfort has been constant and is a 6 out of 10 on a daily basis.  On examination today she has warmth in both knee joints but no effusion noted.  She did not notice any improvement after viscosupplementation in April/May 2024.  She has been trying to get in the pool on a daily basis for exercise but continues to have persistent discomfort and stiffness. Advised to avoid the use of NSAIDs. Different treatment options will be discussed in detail pending upcoming lab work.  Chronic pain of both knees: Warmth but no effusion noted on examination today.  Inadequate response to viscosupplementation in April/May 2024.  Primary osteoarthritis of both feet: Tenderness over MTP joints.   Plantar fasciitis: Not currently symptomatic.   DDD (degenerative disc disease), lumbar: She is not currently experiencing any increased discomfort in her lower back.  No symptoms of radiculopathy.  Trochanteric bursitis, left hip: Not currently  symptomatic.   Other medical conditions are listed as follows:    Overactive bladder  Other fatigue  Orders: No orders of the defined types were placed in this encounter.  No orders of the defined types were placed in this encounter.   Follow-Up Instructions: Return in about 3 months (around 01/17/2023) for Rheumatoid arthritis, Osteoarthritis.   Gearldine Bienenstock, PA-C  Note - This record has been created using Dragon software.  Chart creation errors have been sought, but may not always  have been located. Such creation errors do not reflect on  the standard of medical care.

## 2022-10-09 ENCOUNTER — Other Ambulatory Visit: Payer: Self-pay

## 2022-10-09 DIAGNOSIS — Z79899 Other long term (current) drug therapy: Secondary | ICD-10-CM

## 2022-10-10 ENCOUNTER — Telehealth: Payer: Self-pay | Admitting: *Deleted

## 2022-10-10 DIAGNOSIS — Z79899 Other long term (current) drug therapy: Secondary | ICD-10-CM

## 2022-10-10 LAB — CBC WITH DIFFERENTIAL/PLATELET
Basophils Absolute: 0.1 10*3/uL (ref 0.0–0.2)
Basos: 1 %
EOS (ABSOLUTE): 0.1 10*3/uL (ref 0.0–0.4)
Eos: 2 %
Hematocrit: 36.9 % (ref 34.0–46.6)
Hemoglobin: 12.2 g/dL (ref 11.1–15.9)
Immature Grans (Abs): 0 10*3/uL (ref 0.0–0.1)
Immature Granulocytes: 0 %
Lymphocytes Absolute: 1.8 10*3/uL (ref 0.7–3.1)
Lymphs: 27 %
MCH: 30.2 pg (ref 26.6–33.0)
MCHC: 33.1 g/dL (ref 31.5–35.7)
MCV: 91 fL (ref 79–97)
Monocytes Absolute: 0.8 10*3/uL (ref 0.1–0.9)
Monocytes: 11 %
Neutrophils Absolute: 3.9 10*3/uL (ref 1.4–7.0)
Neutrophils: 59 %
Platelets: 272 10*3/uL (ref 150–450)
RBC: 4.04 x10E6/uL (ref 3.77–5.28)
RDW: 13 % (ref 11.7–15.4)
WBC: 6.7 10*3/uL (ref 3.4–10.8)

## 2022-10-10 LAB — CMP14+EGFR
ALT: 18 IU/L (ref 0–32)
AST: 24 IU/L (ref 0–40)
Albumin: 4 g/dL (ref 3.9–4.9)
Alkaline Phosphatase: 93 IU/L (ref 44–121)
BUN/Creatinine Ratio: 9 — ABNORMAL LOW (ref 12–28)
BUN: 12 mg/dL (ref 8–27)
Bilirubin Total: 0.3 mg/dL (ref 0.0–1.2)
CO2: 21 mmol/L (ref 20–29)
Calcium: 9.1 mg/dL (ref 8.7–10.3)
Chloride: 106 mmol/L (ref 96–106)
Creatinine, Ser: 1.28 mg/dL — ABNORMAL HIGH (ref 0.57–1.00)
Globulin, Total: 2.1 g/dL (ref 1.5–4.5)
Glucose: 82 mg/dL (ref 70–99)
Potassium: 4.4 mmol/L (ref 3.5–5.2)
Sodium: 141 mmol/L (ref 134–144)
Total Protein: 6.1 g/dL (ref 6.0–8.5)
eGFR: 47 mL/min/{1.73_m2} — ABNORMAL LOW (ref >59)

## 2022-10-10 NOTE — Progress Notes (Signed)
Creatinine is elevated.  Patient should avoid meloxicam or any other NSAIDs.  Repeat BMP in 3 weeks.  We may have to reduce the dose of methotrexate if creatinine stays elevated.

## 2022-10-10 NOTE — Telephone Encounter (Signed)
-----   Message from [facility] sent at 10/10/2022  8:19 AM EDT ----- Creatinine is elevated.  Patient should avoid meloxicam or any other NSAIDs.  Repeat BMP in 3 weeks.  We may have to reduce the dose of methotrexate if creatinine stays elevated.

## 2022-10-17 ENCOUNTER — Encounter: Payer: Self-pay | Admitting: Physician Assistant

## 2022-10-17 ENCOUNTER — Ambulatory Visit: Payer: BC Managed Care – PPO | Attending: Physician Assistant | Admitting: Physician Assistant

## 2022-10-17 VITALS — BP 118/73 | HR 61 | Resp 16 | Ht 63.0 in | Wt 178.4 lb

## 2022-10-17 DIAGNOSIS — Z79899 Other long term (current) drug therapy: Secondary | ICD-10-CM | POA: Diagnosis not present

## 2022-10-17 DIAGNOSIS — M0579 Rheumatoid arthritis with rheumatoid factor of multiple sites without organ or systems involvement: Secondary | ICD-10-CM

## 2022-10-17 DIAGNOSIS — M170 Bilateral primary osteoarthritis of knee: Secondary | ICD-10-CM | POA: Diagnosis not present

## 2022-10-17 DIAGNOSIS — G8929 Other chronic pain: Secondary | ICD-10-CM

## 2022-10-17 DIAGNOSIS — M25561 Pain in right knee: Secondary | ICD-10-CM

## 2022-10-17 DIAGNOSIS — R5383 Other fatigue: Secondary | ICD-10-CM

## 2022-10-17 DIAGNOSIS — N3281 Overactive bladder: Secondary | ICD-10-CM

## 2022-10-17 DIAGNOSIS — M722 Plantar fascial fibromatosis: Secondary | ICD-10-CM

## 2022-10-17 DIAGNOSIS — M19072 Primary osteoarthritis, left ankle and foot: Secondary | ICD-10-CM

## 2022-10-17 DIAGNOSIS — M19071 Primary osteoarthritis, right ankle and foot: Secondary | ICD-10-CM

## 2022-10-17 DIAGNOSIS — M25562 Pain in left knee: Secondary | ICD-10-CM

## 2022-10-17 DIAGNOSIS — M7062 Trochanteric bursitis, left hip: Secondary | ICD-10-CM

## 2022-10-17 DIAGNOSIS — M5136 Other intervertebral disc degeneration, lumbar region: Secondary | ICD-10-CM

## 2022-10-17 NOTE — Progress Notes (Signed)
Pharmacy Note  Subjective: Patient presents today to the [facility] for follow up office visit.  Patient seen by the pharmacist for counseling on leflunomide (Arava) and Enbrel for rheumatoid arthritis. She currently takes MTX once weekly and has had elevated Scr.   Objective: CBC    Component Value Date/Time   WBC 6.7 10/09/2022 1337   WBC 7.6 07/12/2022 1556   RBC 4.04 10/09/2022 1337   RBC 4.42 07/12/2022 1556   HGB 12.2 10/09/2022 1337   HCT 36.9 10/09/2022 1337   PLT 272 10/09/2022 1337   MCV 91 10/09/2022 1337   MCH 30.2 10/09/2022 1337   MCH 29.4 07/12/2022 1556   MCHC 33.1 10/09/2022 1337   MCHC 32.6 07/12/2022 1556   RDW 13.0 10/09/2022 1337   LYMPHSABS 1.8 10/09/2022 1337   MONOABS 924 09/01/2016 1031   EOSABS 0.1 10/09/2022 1337   BASOSABS 0.1 10/09/2022 1337    CMP     Component Value Date/Time   NA 141 10/09/2022 1337   K 4.4 10/09/2022 1337   CL 106 10/09/2022 1337   CO2 21 10/09/2022 1337   GLUCOSE 82 10/09/2022 1337   GLUCOSE 100 (H) 07/12/2022 1556   BUN 12 10/09/2022 1337   CREATININE 1.28 (H) 10/09/2022 1337   CREATININE 0.97 07/12/2022 1556   CALCIUM 9.1 10/09/2022 1337   PROT 6.1 10/09/2022 1337   ALBUMIN 4.0 10/09/2022 1337   AST 24 10/09/2022 1337   ALT 18 10/09/2022 1337   ALKPHOS 93 10/09/2022 1337   BILITOT 0.3 10/09/2022 1337   GFRNONAA 83 09/06/2020 1331   GFRAA 96 09/06/2020 1331    Baseline Immunosuppressant Therapy Labs       Lab Results  Component Value Date   HIV NONREACTIVE 04/03/2016          Latest Ref Rng & Units 10/09/2022    1:37 PM  Serum Protein Electrophoresis  Total Protein 6.0 - 8.5 g/dL 6.1    Pregnancy status:  post-menopausal  Assessment/Plan:  Patient was counseled on the purpose, proper use, and adverse effects of leflunomide including risk of infection, nausea/diarrhea/weight loss, increase in blood pressure, rash, hair loss, tingling in the hands and feet, and signs and symptoms of  interstitial lung disease.   Also counseled on Black Box warning of liver injury and importance of avoiding alcohol while on therapy. Discussed that there is the possibility of an increased risk of malignancy but it is not well understood if this increased risk is due to the medication or the disease state.  Counseled patient to avoid live vaccines. Recommend annual influenza, Pneumovax 23, Prevnar 13, and Shingrix as indicated.   Discussed the importance of frequent monitoring of liver function and blood count.  Standing orders placed.  Discussed importance of birth control while on leflunomide due to risk of congenital abnormalities, and patient confirms she is post-menopausal.  Provided patient with educational materials on leflunomide and answered all questions.  Patient consented to [place] use, and consent will be uploaded into the media tab.    Patient dose will be 10mg  once daily x 2 weeks. Repeat CBC/CMP. iF stable, will increase to 20mg  once daily.  Counseled patient that Enbrel is a TNF blocking agent.  Counseled patient on purpose, proper use, and adverse effects of Enbrel.  Reviewed the most common adverse effects including infections, headache, and injection site reactions.  Discussed that there is the possibility of an increased risk of malignancy including non-melanoma skin cancer but it is not well understood  if this increased risk is due to the medication or the disease state.  Advised patient to get yearly dermatology exams due to risk of skin cancer.  Counseled patient that Enbrel should be held prior to scheduled surgery.  Counseled patient to avoid live vaccines while on Enbrel.  Recommend annual influenza, PCV 15 or PCV20 or Pneumovax 23, and Shingrix as indicated.  Reviewed the importance of regular labs while on Enbrel therapy.  Will monitor CBC and CMP 1 month after starting and then every 3 months routinely thereafter. Will monitor TB gold annually. Standing orders placed. Provided  patient with medication education material and answered all questions.  Patient consented to Enbrel.  Will upload consent into the media tab.  Reviewed storage instructions for Enbrel.  Advised initial injection must be administered in office.    Dermatology referral will be placed at new start visit.  Recheck CMP  in 3 weeks prior to making any changes from MTX. If creatinine has normalized, will plan ot keep patient on MTX rather than switching treatment  Chesley Mires, PharmD, MPH, BCPS, CPP Clinical Pharmacist (Rheumatology and Pulmonology)

## 2022-10-17 NOTE — Patient Instructions (Addendum)
Standing Labs We placed an order today for your standing lab work.   Please have your standing labs drawn in 2 weeks and then every 3 months   Please have your labs drawn 2 weeks prior to your appointment so that the provider can discuss your lab results at your appointment, if possible.  Please note that you may see your imaging and lab results in MyChart before we have reviewed them. We will contact you once all results are reviewed. Please allow our office up to 72 hours to thoroughly review all of the results before contacting the office for clarification of your results.  WALK-IN LAB HOURS  Monday through Thursday from 8:00 am -12:30 pm and 1:00 pm-5:00 pm and Friday from 8:00 am-12:00 pm.  Patients with office visits requiring labs will be seen before walk-in labs.  You may encounter longer than normal wait times. Please allow additional time. Wait times may be shorter on  Monday and Thursday afternoons.  We do not book appointments for walk-in labs. We appreciate your patience and understanding with our staff.   Labs are drawn by [facility]. Please bring your co-pay at the time of your lab draw.  You may receive a bill from [facility] for your lab work.  Please note if you are on Hydroxychloroquine and and an order has been placed for a Hydroxychloroquine level,  you will need to have it drawn 4 hours or more after your last dose.  If you wish to have your labs drawn at another location, please call the office 24 hours in advance so we can fax the orders.  The office is located at [address], [place], [place] [address]   If you have any questions regarding directions or hours of operation,  please call [phone].   As a reminder, please drink plenty of water prior to coming for your lab work. Thanks!

## 2022-10-24 ENCOUNTER — Telehealth: Payer: Self-pay

## 2022-10-24 DIAGNOSIS — Z79899 Other long term (current) drug therapy: Secondary | ICD-10-CM

## 2022-10-24 NOTE — Telephone Encounter (Signed)
Patient contacted the office to have labs released to [facility].

## 2022-10-26 NOTE — Progress Notes (Signed)
BMP is normal.  Patient should continue to avoid NSAIDs.

## 2022-11-23 ENCOUNTER — Other Ambulatory Visit: Payer: Self-pay

## 2022-11-23 ENCOUNTER — Other Ambulatory Visit: Payer: Self-pay | Admitting: Internal Medicine

## 2022-11-23 MED ORDER — METHOTREXATE SODIUM 2.5 MG PO TABS: ORAL_TABLET | ORAL | 0 refills | Status: DC

## 2022-11-23 NOTE — Telephone Encounter (Signed)
Patient contacted the office and states she would like a refill of methotrexate sent to [facility] in [place].   Last Fill: 07/27/2022  Labs: 10/25/2022 BMP is normal.  Patient should continue to avoid NSAIDs.   10/09/2022 Creatinine is elevated.  Patient should avoid meloxicam or any other NSAIDs.  Repeat BMP in 3 weeks.  We may have to reduce the dose of methotrexate if creatinine stays elevated.   Next Visit: 01/17/2023  Last Visit: 10/17/2022  DX: Rheumatoid arthritis with rheumatoid factor of multiple sites without organ or systems involvement   Current Dose per office note 10/17/2022: Methotrexate 5 tablets by mouth once weekly   Okay to refill Methotrexate?

## 2022-12-20 HISTORY — PX: COLONOSCOPY: SHX174

## 2023-01-03 NOTE — Progress Notes (Unsigned)
Office Visit Note  Patient: Cathy Casey             Date of Birth: Feb 11, 1961           MRN: [id number]             PCP: Abner Greenspan, MD Referring: Abner Greenspan, MD Visit Date: 01/17/2023 Occupation: @GUAROCC @  Subjective:  Aching in both hands and feet  History of Present Illness: Cathy Casey is a 62 y.o. female with history of seropositive rheumatoid arthritis and osteoarthritis.  Patient remains on Methotrexate 5 tablets by mouth once weekly and folic acid 1 mg p.o. daily.  She is tolerating methotrexate without any side effects and has not had any interruptions in therapy.  Patient states that she has noticed a significant improvement in her knee joint pain since undergoing viscosupplementation at the end of April/early May 2024.  She denies any joint swelling at this time.  She has been walking 45 minutes on a daily basis for exercise without difficulty.  She has been experiencing aching in both hands and both feet, most severe in both feet.  She rates the aching sensation a 6 out of 10 on the pain scale.  She has not noticed any joint swelling but has had some increased edema in her lower legs.  Patient states that she has been experiencing a burning sensation in her feet at night which has been keeping her up at night. She denies any recent or recurrent infections.   Activities of Daily Living:  Patient reports morning stiffness for 1-2 hours.   Patient Reports nocturnal pain.  Difficulty dressing/grooming: Denies Difficulty climbing stairs: Reports Difficulty getting out of chair: Reports Difficulty using hands for taps, buttons, cutlery, and/or writing: Reports  Review of Systems  Constitutional:  Positive for fatigue.  HENT:  Negative for mouth sores and mouth dryness.   Eyes:  Negative for dryness.  Respiratory:  Negative for shortness of breath.   Cardiovascular:  Negative for chest pain and palpitations.  Gastrointestinal:  Negative for blood in stool, constipation and  diarrhea.  Endocrine: Positive for increased urination.  Genitourinary:  Positive for involuntary urination.  Musculoskeletal:  Positive for joint pain, gait problem, joint pain and morning stiffness. Negative for joint swelling, myalgias, muscle weakness, muscle tenderness and myalgias.  Skin:  Negative for color change, rash, hair loss and sensitivity to sunlight.  Allergic/Immunologic: Negative for susceptible to infections.  Neurological:  Negative for dizziness and headaches.  Hematological:  Negative for swollen glands.  Psychiatric/Behavioral:  Positive for sleep disturbance. Negative for depressed mood. The patient is not nervous/anxious.     PMFS History:  Patient Active Problem List   Diagnosis Date Noted   Rheumatoid arthritis with rheumatoid factor of multiple sites without organ or systems involvement (HCC) 04/03/2016   High risk medications (not anticoagulants) long-term use 04/03/2016   Primary osteoarthritis of both knees 03/29/2016   Chronic pain of both knees 03/29/2016    Past Medical History:  Diagnosis Date   History of diverticulosis    Overactive bladder    Rheumatoid arthritis (HCC)     Family History  Problem Relation Age of Onset   Diabetes Mother    Rheum arthritis Sister    Diabetes Sister    Healthy Son    Healthy Son    Healthy Son    Past Surgical History:  Procedure Laterality Date   ABDOMINAL HYSTERECTOMY     CHOLECYSTECTOMY     COLONOSCOPY  12/20/2022  Social History   Social History Narrative   Not on file   Immunization History  Administered Date(s) Administered   Moderna Sars-Covid-2 Vaccination 05/21/2019, 06/18/2019, 12/12/2019, 05/27/2020     Objective: Vital Signs: BP 129/84 (BP Location: Left Arm, Patient Position: Sitting, Cuff Size: Normal)   Pulse 64   Resp 16   Ht 5\' 3"  (1.6 m)   Wt 187 lb 9.6 oz (85.1 kg)   BMI 33.23 kg/m    Physical Exam Vitals and nursing note reviewed.  Constitutional:      Appearance:  She is well-developed.  HENT:     Head: Normocephalic and atraumatic.  Eyes:     Conjunctiva/sclera: Conjunctivae normal.  Cardiovascular:     Rate and Rhythm: Normal rate and regular rhythm.     Heart sounds: Normal heart sounds.  Pulmonary:     Effort: Pulmonary effort is normal.     Breath sounds: Normal breath sounds.  Abdominal:     General: Bowel sounds are normal.     Palpations: Abdomen is soft.  Musculoskeletal:     Cervical back: Normal range of motion.  Lymphadenopathy:     Cervical: No cervical adenopathy.  Skin:    General: Skin is warm and dry.     Capillary Refill: Capillary refill takes less than 2 seconds.  Neurological:     Mental Status: She is alert and oriented to person, place, and time.  Psychiatric:        Behavior: Behavior normal.      Musculoskeletal Exam: C-spine, thoracic spine, lumbar spine good range of motion.  Shoulder joints, elbow joints, wrist joints, MCPs, PIPs, DIPs have good range of motion with no synovitis.  Tenderness over PIP and DIP joints.  No inflammation over MCP joints.  Complete fist formation bilaterally.  Hip joints have good range of motion with no groin pain.  Knee joints have good range of motion no warmth or effusion.  Ankle joints have good range of motion with tenderness upon palpation bilaterally.  Tenderness over MTP joints.  CDAI Exam: CDAI Score: -- Patient Global: --; Provider Global: -- Swollen: --; Tender: -- Joint Exam 01/17/2023   No joint exam has been documented for this visit   There is currently no information documented on the homunculus. Go to the Rheumatology activity and complete the homunculus joint exam.  Investigation: No additional findings.  Imaging: No results found.  Recent Labs: Lab Results  Component Value Date   WBC 6.7 10/09/2022   HGB 12.2 10/09/2022   PLT 272 10/09/2022   NA 141 10/25/2022   K 4.2 10/25/2022   CL 103 10/25/2022   CO2 24 10/25/2022   GLUCOSE 94 10/25/2022    BUN 14 10/25/2022   CREATININE 0.84 10/25/2022   BILITOT 0.3 10/09/2022   ALKPHOS 93 10/09/2022   AST 24 10/09/2022   ALT 18 10/09/2022   PROT 6.1 10/09/2022   ALBUMIN 4.0 10/09/2022   CALCIUM 9.5 10/25/2022   GFRAA 96 09/06/2020    Speciality Comments: No specialty comments available.  Procedures:  No procedures performed Allergies: Patient has no known allergies.     Assessment / Plan:     Visit Diagnoses: Rheumatoid arthritis with rheumatoid factor of multiple sites without organ or systems involvement [facility]): Patient presents today with increased aching in both hands and both feet.  She has no tenderness or synovitis over MCP joints.  Most of her discomfort has been involving the PIP and DIP joints of both hands.  She  does have tenderness across the MTP joints in both feet as well as tenderness over both ankle joints. She has not noticed any joint swelling.  She has been experiencing a burning sensation in both feet making it difficult to fall asleep at night--discussed that the symptoms are concerning for possible neuropathy.  No obvious synovitis was noted on examination today.  She has been taking methotrexate 5 tablets by mouth once weekly and folic acid 1 mg daily.  She continues to tolerate methotrexate without any side effects.  She has not had any interruptions in therapy.  X-rays of both hands and feet will be updated today to assess for radiographic progression.  Sed rate and CRP will be checked today to assess for inflammation.  Discussed that if there has been no radiographic progression and sed rate and CRP were within normal limits but her symptoms continue to progress we can schedule an ultrasound to assess for synovitis.  She will notify us if she develops any new or worsening symptoms.  She will follow-up in the office in 5 months or sooner if needed. - Plan: XR Hand 2 View Left, XR Hand 2 View Right, XR Foot 2 Views Right, XR Foot 2 Views Left, Sedimentation rate,  C-reactive protein  High risk medication use - Methotrexate 5 tablets by mouth once weekly and folic acid 1 mg by mouth daily.  CBC and CMP updated on 10/09/22.  BMP updated on 10/25/22.  Orders for CBC and CMP released today.   No recent or recurrent infections.  Discussed the importance of holding methotrexate if she develops signs or symptoms of an infection and to resume once the infection has completely cleared.   - Plan: CBC with Differential/Platelet, COMPLETE METABOLIC PANEL WITH GFR  Primary osteoarthritis of both knees - S/p euflexxa bilateral knees April/May 2024--She has noticed a significant improvement in her knee joint pain.  She has good range of motion of both knee joints on examination today.  No warmth or effusion noted.  Patient has been able to walk 45 minutes on a daily basis for exercise.   Primary osteoarthritis of both feet: She has been experiencing an increased aching sensation in both feet primarily her ankles and MTP joints.  She has not noticed any joint swelling.  She has been experiencing a burning sensation in both feet at night making it difficult to fall asleep at night.  No obvious synovitis was noted on examination today.  Discussed that the burning sensation in her feet could be due to underlying neuropathy.  X-rays of both feet updated today to assess for radiographic progression.  Plan to check sed rate and CRP today.  Plantar fasciitis: She experiences intermittent flares of plantar fasciitis.  She has been wearing proper fitting shoes.   Spondylosis of lumbar spine: Intermittent discomfort.  No symptoms of radiculopathy.   Other medical conditions are listed as follows:   Trochanteric bursitis, left hip: Not currently symptomatic.   Overactive bladder  Other fatigue  Orders: Orders Placed This Encounter  Procedures   XR Hand 2 View Left   XR Hand 2 View Right   XR Foot 2 Views Right   XR Foot 2 Views Left   CBC with Differential/Platelet    COMPLETE METABOLIC PANEL WITH GFR   Sedimentation rate   C-reactive protein   No orders of the defined types were placed in this encounter.   Follow-Up Instructions: Return in about 5 months (around 06/17/2023) for Rheumatoid arthritis.   Orie Fisherman  Amada Jupiter, PA-C  Note - This record has been created using [employer].  Chart creation errors have been sought, but may not always  have been located. Such creation errors do not reflect on  the standard of medical care.

## 2023-01-17 ENCOUNTER — Encounter: Payer: Self-pay | Admitting: Physician Assistant

## 2023-01-17 ENCOUNTER — Ambulatory Visit: Payer: BC Managed Care – PPO | Attending: Physician Assistant | Admitting: Physician Assistant

## 2023-01-17 ENCOUNTER — Ambulatory Visit (INDEPENDENT_AMBULATORY_CARE_PROVIDER_SITE_OTHER): Payer: BC Managed Care – PPO

## 2023-01-17 ENCOUNTER — Ambulatory Visit: Payer: BC Managed Care – PPO

## 2023-01-17 VITALS — BP 129/84 | HR 64 | Resp 16 | Ht 63.0 in | Wt 187.6 lb

## 2023-01-17 DIAGNOSIS — M79671 Pain in right foot: Secondary | ICD-10-CM

## 2023-01-17 DIAGNOSIS — M0579 Rheumatoid arthritis with rheumatoid factor of multiple sites without organ or systems involvement: Secondary | ICD-10-CM

## 2023-01-17 DIAGNOSIS — M170 Bilateral primary osteoarthritis of knee: Secondary | ICD-10-CM | POA: Diagnosis not present

## 2023-01-17 DIAGNOSIS — M7062 Trochanteric bursitis, left hip: Secondary | ICD-10-CM

## 2023-01-17 DIAGNOSIS — R5383 Other fatigue: Secondary | ICD-10-CM

## 2023-01-17 DIAGNOSIS — M19071 Primary osteoarthritis, right ankle and foot: Secondary | ICD-10-CM

## 2023-01-17 DIAGNOSIS — M47816 Spondylosis without myelopathy or radiculopathy, lumbar region: Secondary | ICD-10-CM

## 2023-01-17 DIAGNOSIS — M79642 Pain in left hand: Secondary | ICD-10-CM

## 2023-01-17 DIAGNOSIS — G8929 Other chronic pain: Secondary | ICD-10-CM

## 2023-01-17 DIAGNOSIS — M79641 Pain in right hand: Secondary | ICD-10-CM | POA: Diagnosis not present

## 2023-01-17 DIAGNOSIS — M79672 Pain in left foot: Secondary | ICD-10-CM

## 2023-01-17 DIAGNOSIS — M722 Plantar fascial fibromatosis: Secondary | ICD-10-CM

## 2023-01-17 DIAGNOSIS — Z79899 Other long term (current) drug therapy: Secondary | ICD-10-CM

## 2023-01-17 DIAGNOSIS — M19072 Primary osteoarthritis, left ankle and foot: Secondary | ICD-10-CM

## 2023-01-17 DIAGNOSIS — N3281 Overactive bladder: Secondary | ICD-10-CM

## 2023-01-17 NOTE — Progress Notes (Signed)
ESR WNL

## 2023-01-17 NOTE — Patient Instructions (Signed)
Standing Labs We placed an order today for your standing lab work.   Please have your standing labs drawn at end of January and every 3 months   Please have your labs drawn 2 weeks prior to your appointment so that the provider can discuss your lab results at your appointment, if possible.  Please note that you may see your imaging and lab results in MyChart before we have reviewed them. We will contact you once all results are reviewed. Please allow our office up to 72 hours to thoroughly review all of the results before contacting the office for clarification of your results.  WALK-IN LAB HOURS  Monday through Thursday from 8:00 am -12:30 pm and 1:00 pm-5:00 pm and Friday from 8:00 am-12:00 pm.  Patients with office visits requiring labs will be seen before walk-in labs.  You may encounter longer than normal wait times. Please allow additional time. Wait times may be shorter on  Monday and Thursday afternoons.  We do not book appointments for walk-in labs. We appreciate your patience and understanding with our staff.   Labs are drawn by [facility]. Please bring your co-pay at the time of your lab draw.  You may receive a bill from [facility] for your lab work.  Please note if you are on Hydroxychloroquine and and an order has been placed for a Hydroxychloroquine level,  you will need to have it drawn 4 hours or more after your last dose.  If you wish to have your labs drawn at another location, please call the office 24 hours in advance so we can fax the orders.  The office is located at [address], [address], [place], [place] [address]   If you have any questions regarding directions or hours of operation,  please call [phone].   As a reminder, please drink plenty of water prior to coming for your lab work. Thanks!

## 2023-01-18 LAB — CBC WITH DIFFERENTIAL/PLATELET
Absolute Lymphocytes: 1902 {cells}/uL (ref 850–3900)
Absolute Monocytes: 847 {cells}/uL (ref 200–950)
Basophils Absolute: 39 {cells}/uL (ref 0–200)
Basophils Relative: 0.5 %
Eosinophils Absolute: 162 {cells}/uL (ref 15–500)
Eosinophils Relative: 2.1 %
HCT: 39 % (ref 35.0–45.0)
Hemoglobin: 12.7 g/dL (ref 11.7–15.5)
MCH: 30.5 pg (ref 27.0–33.0)
MCHC: 32.6 g/dL (ref 32.0–36.0)
MCV: 93.5 fL (ref 80.0–100.0)
MPV: 10.8 fL (ref 7.5–12.5)
Monocytes Relative: 11 %
Neutro Abs: 4751 {cells}/uL (ref 1500–7800)
Neutrophils Relative %: 61.7 %
Platelets: 308 10*3/uL (ref 140–400)
RBC: 4.17 10*6/uL (ref 3.80–5.10)
RDW: 12.8 % (ref 11.0–15.0)
Total Lymphocyte: 24.7 %
WBC: 7.7 10*3/uL (ref 3.8–10.8)

## 2023-01-18 LAB — COMPLETE METABOLIC PANEL WITH GFR
AG Ratio: 1.7 (calc) (ref 1.0–2.5)
ALT: 21 U/L (ref 6–29)
AST: 21 U/L (ref 10–35)
Albumin: 4 g/dL (ref 3.6–5.1)
Alkaline phosphatase (APISO): 89 U/L (ref 37–153)
BUN: 12 mg/dL (ref 7–25)
CO2: 28 mmol/L (ref 20–32)
Calcium: 9 mg/dL (ref 8.6–10.4)
Chloride: 107 mmol/L (ref 98–110)
Creat: 0.75 mg/dL (ref 0.50–1.05)
Globulin: 2.4 g/dL (ref 1.9–3.7)
Glucose, Bld: 80 mg/dL (ref 65–99)
Potassium: 5 mmol/L (ref 3.5–5.3)
Sodium: 140 mmol/L (ref 135–146)
Total Bilirubin: 0.4 mg/dL (ref 0.2–1.2)
Total Protein: 6.4 g/dL (ref 6.1–8.1)
eGFR: 90 mL/min/{1.73_m2} (ref >=60)

## 2023-01-18 LAB — C-REACTIVE PROTEIN: CRP: 5.8 mg/L (ref <8.0)

## 2023-01-18 LAB — SEDIMENTATION RATE: Sed Rate: 9 mm/h (ref 0–30)

## 2023-01-18 NOTE — Progress Notes (Signed)
CBC and CMP WNL ESR and CRP WNL

## 2023-01-18 NOTE — Progress Notes (Signed)
X-rays of both hands and feet are consistent with osteoarthritis.  No radiographic progression noted since 2022. Please notify the patient.   If her symptoms persist or worsen we can schedule an ultrasound for further evaluation.

## 2023-02-19 ENCOUNTER — Encounter: Payer: Self-pay | Admitting: Podiatry

## 2023-02-19 ENCOUNTER — Ambulatory Visit: Payer: BC Managed Care – PPO | Admitting: Podiatry

## 2023-02-19 DIAGNOSIS — M722 Plantar fascial fibromatosis: Secondary | ICD-10-CM

## 2023-02-19 DIAGNOSIS — Q666 Other congenital valgus deformities of feet: Secondary | ICD-10-CM

## 2023-02-19 MED ORDER — TRIAMCINOLONE ACETONIDE 10 MG/ML IJ SUSP
10.0000 mg | Freq: Once | INTRAMUSCULAR | Status: AC
  Administered 2023-02-19: 10 mg via INTRAMUSCULAR

## 2023-02-19 MED ORDER — METHYLPREDNISOLONE 4 MG PO TBPK: ORAL_TABLET | ORAL | 0 refills | Status: DC

## 2023-02-19 NOTE — Patient Instructions (Signed)

## 2023-02-20 NOTE — Progress Notes (Signed)
Subjective:  Patient ID: Cathy Casey, female    DOB: 1960/11/16,  MRN: [id number]  Chief Complaint  Patient presents with   Plantar Fasciitis    Plantar fasciitis - both feet.  Had injections last year.  Feet get hot and tingle.  Also has heel spurs.     62 y.o. female presents with the above complaint.  Patient presents for follow-up bilateral plantar fasciitis.  Patient was last seen in April of last year by Dr. Allena Katz, she had been doing well for quite some time.  She does have history of seropositive rheumatoid arthritis.  She states that over the past couple months she has had recurrence of the pain to her heels.  She has been using custom inserts. Also complains of pain to balls of her feet.   Review of Systems: Negative except as noted in the HPI. Denies N/V/F/Ch.  Past Medical History:  Diagnosis Date   History of diverticulosis    Overactive bladder    Rheumatoid arthritis (HCC)     Current Outpatient Medications:    methylPREDNISolone (MEDROL DOSEPAK) 4 MG TBPK tablet, 6 Day Tapering Dose, Disp: 21 tablet, Rfl: 0   Cyanocobalamin (VITAMIN B-12 PO), Take by mouth daily., Disp: , Rfl:    fluticasone (FLONASE) 50 MCG/ACT nasal spray, as needed., Disp: , Rfl:    folic acid (FOLVITE) 1 MG tablet, TAKE 1 TABLET(1 MG) BY MOUTH DAILY, Disp: 90 tablet, Rfl: 3   GEMTESA 75 MG TABS, Take 1 tablet by mouth daily., Disp: , Rfl:    meloxicam (MOBIC) 15 MG tablet, TAKE 1 TABLET(15 MG) BY MOUTH DAILY AS NEEDED FOR PAIN (Patient not taking: Reported on 10/17/2022), Disp: 90 tablet, Rfl: 0   methotrexate (RHEUMATREX) 2.5 MG tablet, TAKE 5 TABLETS BY MOUTH ONCE WEEKLY. Caution:Chemotherapy. Protect from light., Disp: 60 tablet, Rfl: 0   Multiple Vitamins-Minerals (MULTIVITAMIN WITH MINERALS) tablet, Take 1 tablet by mouth daily., Disp: , Rfl:    Semaglutide (OZEMPIC, 0.25 OR 0.5 MG/DOSE, ), Inject into the skin once a week. (Patient not taking: Reported on 01/17/2022), Disp: , Rfl:    Sodium  Hyaluronate, Viscosup, (EUFLEXXA) 20 MG/2ML SOSY, Inject 1 Euflexxa syringe into each knee weekly for 3 weeks. 2 boxes total with 3 syringes in each box. (Patient not taking: Reported on 07/12/2022), Disp: 6 mL, Rfl: 0  Social History   Tobacco Use  Smoking Status Never   Passive exposure: Current  Smokeless Tobacco Never    No Known Allergies Objective:  There were no vitals filed for this visit. There is no height or weight on file to calculate BMI. Constitutional Well developed. Well nourished.  Vascular Dorsalis pedis pulses palpable bilaterally. Posterior tibial pulses palpable bilaterally. Capillary refill normal to all digits.  No cyanosis or clubbing noted. Pedal hair growth normal.  Neurologic Normal speech. Oriented to person, place, and time. Epicritic sensation to light touch grossly present bilaterally.  Dermatologic Nails well groomed and normal in appearance. No open wounds. No skin lesions.  Orthopedic: Normal joint ROM without pain or crepitus bilaterally. No visible deformities. Tender to palpation at the calcaneal tuber bilaterally. No pain with calcaneal squeeze bilaterally. Ankle ROM diminished range of motion bilaterally. Silfverskiold Test: positive bilaterally. Fat pad atrophy present to the ball of the foot bilaterally with prominent metatarsal heads plantar aspect.   Radiographs: Reviewed from rheumatologist office. No acute fractures or dislocations. No evidence of stress fracture.  Significant plantar heel spur present. Posterior heel spur absent.  Pes planovalgus foot structure  noted  Assessment:   1. Plantar fasciitis of left foot   2. Plantar fasciitis of right foot   3. Pes planovalgus     Plan:  Patient was evaluated and treated and all questions answered.  Plantar Fasciitis, bilaterally - Educated on icing and stretching. Instructions given.  -Bilateral corticosteroid injection delivered to the plantar fascia as below. - DME: Plantar  fascial brace dispensed to support the medial longitudinal arch of the foot and offload pressure from the heel and prevent arch collapse during weightbearing - CAM boot dispensed to the left lower extremity to be worn with activity. Advised patient focus on the cam boot to the left side and fascial brace to the right. - Pharmacologic management: Medrol Dosepak steroid taper prescribed -Regarding heel spurs, discussed that typically these are asymptomatic, did discuss that since she has had some recurrence, surgical treatment could be considered if we cannot alleviate her symptoms with conservative therapy.  Procedure: Injection Tendon/Ligament Location: Bilateral plantar fascia at the glabrous junction; medial approach. Skin Prep: alcohol Injectate: 0.5 cc 0.5% marcaine plain, 0.5 cc of 1% Lidocaine, 0.5 cc kenalog 10. Disposition: Patient tolerated procedure well. Injection site dressed with a band-aid.     Return in about 2 weeks (around 03/05/2023).

## 2023-02-25 ENCOUNTER — Other Ambulatory Visit: Payer: Self-pay | Admitting: Internal Medicine

## 2023-02-26 NOTE — Telephone Encounter (Signed)
Last Fill: 11/23/2022  Labs: 01/17/2023 CBC and CMP WNL ESR and CRP WNL  Next Visit: 06/21/2023  Last Visit: 01/17/2023  DX: Rheumatoid arthritis with rheumatoid factor of multiple sites without organ or systems involvement   Current Dose per office note 01/17/2023: Methotrexate 5 tablets by mouth once weekly   Okay to refill Methotrexate?

## 2023-03-05 ENCOUNTER — Ambulatory Visit: Payer: BC Managed Care – PPO | Admitting: Podiatry

## 2023-04-03 ENCOUNTER — Ambulatory Visit: Payer: BC Managed Care – PPO | Admitting: Podiatry

## 2023-04-16 ENCOUNTER — Other Ambulatory Visit: Payer: Self-pay | Admitting: *Deleted

## 2023-04-16 DIAGNOSIS — Z131 Encounter for screening for diabetes mellitus: Secondary | ICD-10-CM

## 2023-04-16 DIAGNOSIS — Z79899 Other long term (current) drug therapy: Secondary | ICD-10-CM

## 2023-04-16 DIAGNOSIS — Z13220 Encounter for screening for lipoid disorders: Secondary | ICD-10-CM

## 2023-04-18 LAB — CBC WITH DIFFERENTIAL/PLATELET
Basophils Absolute: 0.1 10*3/uL (ref 0.0–0.2)
Basos: 1 %
EOS (ABSOLUTE): 0.1 10*3/uL (ref 0.0–0.4)
Eos: 2 %
Hematocrit: 38.6 % (ref 34.0–46.6)
Hemoglobin: 12.7 g/dL (ref 11.1–15.9)
Immature Grans (Abs): 0 10*3/uL (ref 0.0–0.1)
Immature Granulocytes: 0 %
Lymphocytes Absolute: 1.8 10*3/uL (ref 0.7–3.1)
Lymphs: 28 %
MCH: 30 pg (ref 26.6–33.0)
MCHC: 32.9 g/dL (ref 31.5–35.7)
MCV: 91 fL (ref 79–97)
Monocytes Absolute: 0.7 10*3/uL (ref 0.1–0.9)
Monocytes: 11 %
Neutrophils Absolute: 3.7 10*3/uL (ref 1.4–7.0)
Neutrophils: 58 %
Platelets: 316 10*3/uL (ref 150–450)
RBC: 4.24 x10E6/uL (ref 3.77–5.28)
RDW: 13 % (ref 11.7–15.4)
WBC: 6.4 10*3/uL (ref 3.4–10.8)

## 2023-04-18 LAB — CMP14+EGFR
ALT: 24 [IU]/L (ref 0–32)
AST: 28 [IU]/L (ref 0–40)
Albumin: 4.2 g/dL (ref 3.9–4.9)
Alkaline Phosphatase: 105 [IU]/L (ref 44–121)
BUN/Creatinine Ratio: 11 — ABNORMAL LOW (ref 12–28)
BUN: 10 mg/dL (ref 8–27)
Bilirubin Total: 0.4 mg/dL (ref 0.0–1.2)
CO2: 23 mmol/L (ref 20–29)
Calcium: 8.9 mg/dL (ref 8.7–10.3)
Chloride: 104 mmol/L (ref 96–106)
Creatinine, Ser: 0.93 mg/dL (ref 0.57–1.00)
Globulin, Total: 2.3 g/dL (ref 1.5–4.5)
Glucose: 78 mg/dL (ref 70–99)
Potassium: 4.3 mmol/L (ref 3.5–5.2)
Sodium: 142 mmol/L (ref 134–144)
Total Protein: 6.5 g/dL (ref 6.0–8.5)
eGFR: 69 mL/min/{1.73_m2} (ref >59)

## 2023-04-18 LAB — LIPID PANEL
Chol/HDL Ratio: 2.4 {ratio} (ref 0.0–4.4)
Cholesterol, Total: 195 mg/dL (ref 100–199)
HDL: 82 mg/dL (ref >39)
LDL Chol Calc (NIH): 96 mg/dL (ref 0–99)
Triglycerides: 94 mg/dL (ref 0–149)
VLDL Cholesterol Cal: 17 mg/dL (ref 5–40)

## 2023-04-18 LAB — HEMOGLOBIN A1C
Est. average glucose Bld gHb Est-mCnc: 105 mg/dL
Hgb A1c MFr Bld: 5.3 % (ref 4.8–5.6)

## 2023-04-18 NOTE — Progress Notes (Signed)
CBC and CMP are normal.  Lipid panel is normal.  Hemoglobin A1c is in the desirable range.  Please forward results to her PCP.

## 2023-04-26 ENCOUNTER — Other Ambulatory Visit: Payer: Self-pay | Admitting: Family Medicine

## 2023-04-26 DIAGNOSIS — Z1231 Encounter for screening mammogram for malignant neoplasm of breast: Secondary | ICD-10-CM

## 2023-05-02 ENCOUNTER — Ambulatory Visit
Admission: RE | Admit: 2023-05-02 | Discharge: 2023-05-02 | Disposition: A | Discharge status: Home / Self Care | Payer: 59 | Source: Ambulatory Visit | Attending: Family Medicine | Admitting: Family Medicine

## 2023-05-02 DIAGNOSIS — Z1231 Encounter for screening mammogram for malignant neoplasm of breast: Secondary | ICD-10-CM

## 2023-05-08 ENCOUNTER — Telehealth: Payer: Self-pay | Admitting: *Deleted

## 2023-05-08 NOTE — Telephone Encounter (Signed)
 Patient contacted the office requesting to apply for Visco for bilateral knees.

## 2023-05-08 NOTE — Telephone Encounter (Signed)
Ok to reapply for visco-supplementation for both knees.

## 2023-05-08 NOTE — Telephone Encounter (Signed)
 VOB submitted for Orthovisc, Bilateral knee(s) BV pending

## 2023-05-09 NOTE — Telephone Encounter (Signed)
 Prior authorization required by patient's insurance PA submitted and pending

## 2023-05-10 NOTE — Telephone Encounter (Signed)
 Please call to schedule visco injections.  Approved for Orthovisc, Bilateral knee(s). [employer] Deductible does not apply $80 co-pay Once the OOP has been met $4890 (met $15) patient is covered at 100% Approval #[id number] Approval dates: 05/09/2023 to 05/07/2024

## 2023-05-18 ENCOUNTER — Other Ambulatory Visit: Payer: Self-pay | Admitting: Physician Assistant

## 2023-05-18 NOTE — Telephone Encounter (Signed)
 Last Fill: 02/26/2023  Labs: 04/17/2023 CBC and CMP are normal.  Lipid panel is normal.  Hemoglobin A1c is in the desirable range.  Please forward results to her PCP.   Next Visit: 06/21/2023  Last Visit: 01/17/2023  DX: Rheumatoid arthritis with rheumatoid factor of multiple sites without organ or systems involvement (HCC)   Current Dose per office note 01/17/2023: Methotrexate 5 tablets by mouth once weekly   Okay to refill Methotrexate?

## 2023-06-11 NOTE — Progress Notes (Signed)
 Office Visit Note  Patient: Cathy Casey             Date of Birth: November 29, 1960           MRN: [id number]             PCP: Abner Greenspan, MD Referring: Abner Greenspan, MD Visit Date: 06/21/2023 Occupation: @GUAROCC @  Subjective:  Medication management, pain in both knees  History of Present Illness: Antigone Crowell is a 63 y.o. female with seropositive rheumatoid arthritis and osteoarthritis.  She returns today after her last visit in October 2024.  She states she continues to have some stiffness in her bilateral thumb.  She has not experienced any joint swelling.  She continues to take methotrexate 5 tablets weekly along with folic acid.  She continues to have pain and discomfort in her knee joints.  She had good response to viscosupplement injections in the past and wants to have repeat viscosupplement injections today.  Her last viscosupplement injection was on May 2024.    Activities of Daily Living:  Patient reports morning stiffness for 30-60 minutes.   Patient Reports nocturnal pain.  Difficulty dressing/grooming: Denies Difficulty climbing stairs: Reports Difficulty getting out of chair: Reports Difficulty using hands for taps, buttons, cutlery, and/or writing: Denies  Review of Systems  Constitutional:  Positive for fatigue.  HENT:  Negative for mouth sores and mouth dryness.   Eyes:  Negative for dryness.  Respiratory:  Negative for shortness of breath and difficulty breathing.   Cardiovascular:  Negative for chest pain and palpitations.  Gastrointestinal:  Negative for blood in stool, constipation and diarrhea.  Endocrine: Positive for increased urination.  Genitourinary:  Negative for painful urination and involuntary urination.  Musculoskeletal:  Positive for joint pain, joint pain and morning stiffness. Negative for gait problem, joint swelling, myalgias, muscle weakness, muscle tenderness and myalgias.  Skin:  Negative for color change, rash, hair loss and sensitivity to  sunlight.  Allergic/Immunologic: Negative for susceptible to infections.  Neurological:  Negative for dizziness and headaches.  Hematological:  Negative for swollen glands.  Psychiatric/Behavioral:  Positive for sleep disturbance. Negative for depressed mood. The patient is not nervous/anxious.     PMFS History:  Patient Active Problem List   Diagnosis Date Noted   Rheumatoid arthritis with rheumatoid factor of multiple sites without organ or systems involvement (HCC) 04/03/2016   High risk medications (not anticoagulants) long-term use 04/03/2016   Primary osteoarthritis of both knees 03/29/2016   Chronic pain of both knees 03/29/2016    Past Medical History:  Diagnosis Date   History of diverticulosis    Overactive bladder    Rheumatoid arthritis (HCC)     Family History  Problem Relation Age of Onset   Diabetes Mother    Rheum arthritis Sister    Diabetes Sister    Healthy Son    Healthy Son    Healthy Son    Past Surgical History:  Procedure Laterality Date   ABDOMINAL HYSTERECTOMY     CHOLECYSTECTOMY     COLONOSCOPY  12/20/2022   Social History   Social History Narrative   Not on file   Immunization History  Administered Date(s) Administered   Moderna Sars-Covid-2 Vaccination 05/21/2019, 06/18/2019, 12/12/2019, 05/27/2020     Objective: Vital Signs: BP 108/72 (BP Location: Right Wrist, Patient Position: Sitting, Cuff Size: Normal)   Pulse 70   Resp 16   Ht 5\' 3"  (1.6 m)   Wt 195 lb 9.6 oz (88.7 kg)   BMI  34.65 kg/m    Physical Exam Vitals and nursing note reviewed.  Constitutional:      Appearance: She is well-developed.  HENT:     Head: Normocephalic and atraumatic.  Eyes:     Conjunctiva/sclera: Conjunctivae normal.  Cardiovascular:     Rate and Rhythm: Normal rate and regular rhythm.     Heart sounds: Normal heart sounds.  Pulmonary:     Effort: Pulmonary effort is normal.     Breath sounds: Normal breath sounds.  Abdominal:     General:  Bowel sounds are normal.     Palpations: Abdomen is soft.  Musculoskeletal:     Cervical back: Normal range of motion.  Lymphadenopathy:     Cervical: No cervical adenopathy.  Skin:    General: Skin is warm and dry.     Capillary Refill: Capillary refill takes less than 2 seconds.  Neurological:     Mental Status: She is alert and oriented to person, place, and time.  Psychiatric:        Behavior: Behavior normal.      Musculoskeletal Exam: Cervical, thoracic and lumbar spine were in good range of motion.  Shoulders, elbows, wrists, MCPs PIPs and DIPs with good range of motion.  She had mild tenderness over bilateral first MCP joints but no synovitis was noted.  Hip joints and knee joints were in good range of motion without any warmth swelling or effusion.  She had crepitus in the bilateral knee joints.  There was no tenderness or swelling over ankles or MTPs.  CDAI Exam: CDAI Score: -- Patient Global: 10 / 100; Provider Global: 10 / 100 Swollen: --; Tender: -- Joint Exam 06/21/2023   No joint exam has been documented for this visit   There is currently no information documented on the homunculus. Go to the Rheumatology activity and complete the homunculus joint exam.  Investigation: No additional findings.  Imaging: No results found.  Recent Labs: Lab Results  Component Value Date   WBC 6.4 04/17/2023   HGB 12.7 04/17/2023   PLT 316 04/17/2023   NA 142 04/17/2023   K 4.3 04/17/2023   CL 104 04/17/2023   CO2 23 04/17/2023   GLUCOSE 78 04/17/2023   BUN 10 04/17/2023   CREATININE 0.93 04/17/2023   BILITOT 0.4 04/17/2023   ALKPHOS 105 04/17/2023   AST 28 04/17/2023   ALT 24 04/17/2023   PROT 6.5 04/17/2023   ALBUMIN 4.2 04/17/2023   CALCIUM 8.9 04/17/2023   GFRAA 96 09/06/2020   January 17, 2023 sed rate 9, CRP 5.8  January 17, 2023 x-rays obtained of bilateral hands and feet resistive of osteoarthritis.  Speciality Comments: No specialty comments  available.  Procedures:  Large Joint Inj: bilateral knee on 06/21/2023 11:23 AM Indications: pain Details: 25 G 1.5 in needle, medial approach  Arthrogram: No  Medications (Right): 1.5 mL lidocaine 1 %; 30 mg Hyaluronan 30 MG/2ML Aspirate (Right): 0 mL Medications (Left): 1.5 mL lidocaine 1 %; 30 mg Hyaluronan 30 MG/2ML Aspirate (Left): 0 mL Outcome: tolerated well, no immediate complications Procedure, treatment alternatives, risks and benefits explained, specific risks discussed. Consent was given by the patient. Immediately prior to procedure a time out was called to verify the correct patient, procedure, equipment, support staff and site/side marked as required.     Allergies: Patient has no known allergies.   Assessment / Plan:     Visit Diagnoses: Rheumatoid arthritis with rheumatoid factor of multiple sites without organ or systems involvement (HCC) -  patient states that she continues to have some discomfort in her bilateral thumb.  She denies any history of joint swelling.  No synovitis was noted on the examination.  She had mild tenderness over bilateral first MCP joints.  She states the pain has been more since she has been lifting weights.  X-rays obtained in the past of bilateral hands and feet were suggestive of osteoarthritis.  High risk medication use - Methotrexate 5 tablets p.o. weekly and folic acid 1 mg p.o. daily.  Labs obtained in January showed normal CBC and CMP.  She was advised to get repeat labs in April and every 3 months to monitor for drug toxicity.  Information for immunization was placed in the AVS.  She was advised to hold methotrexate if develops an infection resume of the infection resolves.  Primary osteoarthritis of both knees - Bilateral knee joint Euflexxa injection May 2024.  Patient is here to receive repeat viscosupplement injections.  She had good response to Euflexxa injections.  After informed consent was obtained and the side effects were discussed  bilateral knee joints were injected with Orthovisc as described above.  Patient tolerated the procedure well.  Postprocedure instructions were given.  Patient states that she has been exercising on a regular basis.  She had significant improvement in her knee joint pain when she had intentional weight loss while she was on Ozempic.  The pain has recurred now.  Primary osteoarthritis of both feet-she denies any discomfort.  Plantar fasciitis - Intermittent flares.  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  Screening for lipid disorders - April 17, 2023 lipid panel normal.  Screening for diabetes mellitus (DM) - April 17, 2023 hemoglobin A1c 5.3.  Overactive bladder  Orders: Orders Placed This Encounter  Procedures   Large Joint Inj: bilateral knee   No orders of the defined types were placed in this encounter.   Follow-Up Instructions: Return in about 5 months (around 11/21/2023) for Rheumatoid arthritis, Osteoarthritis.   Pollyann Savoy, MD  Note - This record has been created using Animal nutritionist.  Chart creation errors have been sought, but may not always  have been located. Such creation errors do not reflect on  the standard of medical care.

## 2023-06-21 ENCOUNTER — Encounter: Payer: Self-pay | Admitting: Rheumatology

## 2023-06-21 ENCOUNTER — Ambulatory Visit: Payer: BC Managed Care – PPO | Attending: Rheumatology | Admitting: Rheumatology

## 2023-06-21 VITALS — BP 108/72 | HR 70 | Resp 16 | Ht 63.0 in | Wt 195.6 lb

## 2023-06-21 DIAGNOSIS — Z79899 Other long term (current) drug therapy: Secondary | ICD-10-CM

## 2023-06-21 DIAGNOSIS — M19071 Primary osteoarthritis, right ankle and foot: Secondary | ICD-10-CM

## 2023-06-21 DIAGNOSIS — Z13220 Encounter for screening for lipoid disorders: Secondary | ICD-10-CM

## 2023-06-21 DIAGNOSIS — N3281 Overactive bladder: Secondary | ICD-10-CM

## 2023-06-21 DIAGNOSIS — Z131 Encounter for screening for diabetes mellitus: Secondary | ICD-10-CM

## 2023-06-21 DIAGNOSIS — M19072 Primary osteoarthritis, left ankle and foot: Secondary | ICD-10-CM

## 2023-06-21 DIAGNOSIS — M722 Plantar fascial fibromatosis: Secondary | ICD-10-CM

## 2023-06-21 DIAGNOSIS — M170 Bilateral primary osteoarthritis of knee: Secondary | ICD-10-CM

## 2023-06-21 DIAGNOSIS — M47816 Spondylosis without myelopathy or radiculopathy, lumbar region: Secondary | ICD-10-CM

## 2023-06-21 DIAGNOSIS — M0579 Rheumatoid arthritis with rheumatoid factor of multiple sites without organ or systems involvement: Secondary | ICD-10-CM | POA: Diagnosis not present

## 2023-06-21 MED ORDER — HYALURONAN 30 MG/2ML IX SOSY
30.0000 mg | PREFILLED_SYRINGE | INTRA_ARTICULAR | Status: AC | PRN
  Administered 2023-06-21: 30 mg via INTRA_ARTICULAR

## 2023-06-21 MED ORDER — LIDOCAINE HCL 1 % IJ SOLN
1.5000 mL | INTRAMUSCULAR | Status: AC | PRN
  Administered 2023-06-21: 1.5 mL

## 2023-06-21 NOTE — Patient Instructions (Signed)
 Standing Labs We placed an order today for your standing lab work.   Please have your standing labs drawn in April and every 3 months  Please have your labs drawn 2 weeks prior to your appointment so that the provider can discuss your lab results at your appointment, if possible.  Please note that you may see your imaging and lab results in MyChart before we have reviewed them. We will contact you once all results are reviewed. Please allow our office up to 72 hours to thoroughly review all of the results before contacting the office for clarification of your results.  WALK-IN LAB HOURS  Monday through Thursday from 8:00 am -12:30 pm and 1:00 pm-5:00 pm and Friday from 8:00 am-12:00 pm.  Patients with office visits requiring labs will be seen before walk-in labs.  You may encounter longer than normal wait times. Please allow additional time. Wait times may be shorter on  Monday and Thursday afternoons.  We do not book appointments for walk-in labs. We appreciate your patience and understanding with our staff.   Labs are drawn by [facility]. Please bring your co-pay at the time of your lab draw.  You may receive a bill from [facility] for your lab work.  Please note if you are on Hydroxychloroquine and and an order has been placed for a Hydroxychloroquine level,  you will need to have it drawn 4 hours or more after your last dose.  If you wish to have your labs drawn at another location, please call the office 24 hours in advance so we can fax the orders.  The office is located at [address], [place], [place] [address]   If you have any questions regarding directions or hours of operation,  please call [phone].   As a reminder, please drink plenty of water prior to coming for your lab work. Thanks!   Vaccines You are taking a medication(s) that can suppress your immune system.  The following immunizations are recommended: Flu annually Covid-19  RSV Td/Tdap (tetanus,  diphtheria, pertussis) every 10 years Pneumonia (Prevnar 15 then Pneumovax 23 at least 1 year apart.  Alternatively, can take Prevnar 20 without needing additional dose) Shingrix: 2 doses from 4 weeks to 6 months apart  Please check with your PCP to make sure you are up to date.  If you have signs or symptoms of an infection or start antibiotics: First, call your PCP for workup of your infection. Hold your medication through the infection, until you complete your antibiotics, and until symptoms resolve if you take the following: Injectable medication (Actemra, Benlysta, Cimzia, Cosentyx, Enbrel, Humira, Kevzara, Orencia, Remicade, Simponi, Stelara, Taltz, Tremfya) Methotrexate Leflunomide (Arava) Mycophenolate (Cellcept) Harriette Ohara, Olumiant, or Rinvoq

## 2023-06-28 ENCOUNTER — Ambulatory Visit: Attending: Physician Assistant | Admitting: Physician Assistant

## 2023-06-28 DIAGNOSIS — M170 Bilateral primary osteoarthritis of knee: Secondary | ICD-10-CM | POA: Diagnosis not present

## 2023-06-28 MED ORDER — HYALURONAN 30 MG/2ML IX SOSY
30.0000 mg | PREFILLED_SYRINGE | INTRA_ARTICULAR | Status: AC | PRN
  Administered 2023-06-28: 30 mg via INTRA_ARTICULAR

## 2023-06-28 MED ORDER — LIDOCAINE HCL 1 % IJ SOLN
1.5000 mL | INTRAMUSCULAR | Status: AC | PRN
Start: 2023-06-28 — End: 2023-06-28
  Administered 2023-06-28: 1.5 mL

## 2023-06-28 MED ORDER — LIDOCAINE HCL 1 % IJ SOLN
1.5000 mL | INTRAMUSCULAR | Status: AC | PRN
  Administered 2023-06-28: 1.5 mL

## 2023-06-28 NOTE — Progress Notes (Signed)
   Procedure Note  Patient: Cathy Casey             Date of Birth: 06-24-1960           MRN: [id number]             Visit Date: 06/28/2023  Procedures: Visit Diagnoses:  1. Primary osteoarthritis of both knees    Orthovisc #2 bilateral knees, B/B Large Joint Inj: bilateral knee on 06/28/2023 10:36 AM Indications: pain Details: 25 G 1.5 in needle, medial approach  Arthrogram: No  Medications (Right): 1.5 mL lidocaine 1 %; 30 mg Hyaluronan 30 MG/2ML Aspirate (Right): 0 mL Medications (Left): 1.5 mL lidocaine 1 %; 30 mg Hyaluronan 30 MG/2ML Aspirate (Left): 0 mL Outcome: tolerated well, no immediate complications Procedure, treatment alternatives, risks and benefits explained, specific risks discussed. Consent was given by the patient. Immediately prior to procedure a time out was called to verify the correct patient, procedure, equipment, support staff and site/side marked as required.     Patient tolerated the procedures well.  Aftercare was discussed.  Sherron Ales, PA-C

## 2023-07-05 ENCOUNTER — Ambulatory Visit: Attending: Physician Assistant | Admitting: Physician Assistant

## 2023-07-05 DIAGNOSIS — Z79899 Other long term (current) drug therapy: Secondary | ICD-10-CM

## 2023-07-05 DIAGNOSIS — M170 Bilateral primary osteoarthritis of knee: Secondary | ICD-10-CM | POA: Diagnosis not present

## 2023-07-05 MED ORDER — HYALURONAN 30 MG/2ML IX SOSY
30.0000 mg | PREFILLED_SYRINGE | INTRA_ARTICULAR | Status: AC | PRN
  Administered 2023-07-05: 30 mg via INTRA_ARTICULAR

## 2023-07-05 MED ORDER — LIDOCAINE HCL 1 % IJ SOLN
1.5000 mL | INTRAMUSCULAR | Status: AC | PRN
  Administered 2023-07-05: 1.5 mL

## 2023-07-05 NOTE — Progress Notes (Signed)
   Procedure Note  Patient: Cathy Casey             Date of Birth: 01/14/1961           MRN: [id number]             Visit Date: 07/05/2023  Procedures: Visit Diagnoses:  1. High risk medication use   2. Primary osteoarthritis of both knees     Orthovisc #3 bilateral knees, B/B Large Joint Inj: bilateral knee on 07/05/2023 10:32 AM Indications: pain Details: 25 G 1.5 in needle, medial approach  Arthrogram: No  Medications (Right): 1.5 mL lidocaine 1 %; 30 mg Hyaluronan 30 MG/2ML Aspirate (Right): 0 mL Medications (Left): 1.5 mL lidocaine 1 %; 30 mg Hyaluronan 30 MG/2ML Aspirate (Left): 0 mL Outcome: tolerated well, no immediate complications Procedure, treatment alternatives, risks and benefits explained, specific risks discussed. Consent was given by the patient. Immediately prior to procedure a time out was called to verify the correct patient, procedure, equipment, support staff and site/side marked as required.     Patient tolerated the procedures well. Aftercare was discussed. Jacinta Martinis, PA-C

## 2023-07-05 NOTE — Progress Notes (Signed)
 CBC WNL

## 2023-07-06 LAB — COMPREHENSIVE METABOLIC PANEL WITH GFR
AG Ratio: 1.7 (calc) (ref 1.0–2.5)
ALT: 31 U/L — ABNORMAL HIGH (ref 6–29)
AST: 36 U/L — ABNORMAL HIGH (ref 10–35)
Albumin: 4 g/dL (ref 3.6–5.1)
Alkaline phosphatase (APISO): 105 U/L (ref 37–153)
BUN: 11 mg/dL (ref 7–25)
CO2: 27 mmol/L (ref 20–32)
Calcium: 9.2 mg/dL (ref 8.6–10.4)
Chloride: 108 mmol/L (ref 98–110)
Creat: 0.75 mg/dL (ref 0.50–1.05)
Globulin: 2.4 g/dL (ref 1.9–3.7)
Glucose, Bld: 79 mg/dL (ref 65–99)
Potassium: 4.4 mmol/L (ref 3.5–5.3)
Sodium: 142 mmol/L (ref 135–146)
Total Bilirubin: 0.7 mg/dL (ref 0.2–1.2)
Total Protein: 6.4 g/dL (ref 6.1–8.1)
eGFR: 90 mL/min/{1.73_m2} (ref >=60)

## 2023-07-06 LAB — CBC WITH DIFFERENTIAL/PLATELET
Absolute Lymphocytes: 1859 {cells}/uL (ref 850–3900)
Absolute Monocytes: 871 {cells}/uL (ref 200–950)
Basophils Absolute: 59 {cells}/uL (ref 0–200)
Basophils Relative: 0.9 %
Eosinophils Absolute: 150 {cells}/uL (ref 15–500)
Eosinophils Relative: 2.3 %
HCT: 39.2 % (ref 35.0–45.0)
Hemoglobin: 13 g/dL (ref 11.7–15.5)
MCH: 30.2 pg (ref 27.0–33.0)
MCHC: 33.2 g/dL (ref 32.0–36.0)
MCV: 91.2 fL (ref 80.0–100.0)
MPV: 11.2 fL (ref 7.5–12.5)
Monocytes Relative: 13.4 %
Neutro Abs: 3562 {cells}/uL (ref 1500–7800)
Neutrophils Relative %: 54.8 %
Platelets: 305 10*3/uL (ref 140–400)
RBC: 4.3 10*6/uL (ref 3.80–5.10)
RDW: 12.5 % (ref 11.0–15.0)
Total Lymphocyte: 28.6 %
WBC: 6.5 10*3/uL (ref 3.8–10.8)

## 2023-07-06 NOTE — Progress Notes (Signed)
 AST and ALT are borderline elevated. Please clarify if she has been taking any tylenol? Alcohol use? Recent infections or antibiotic use?  Rest of CMP WNL.

## 2023-07-25 ENCOUNTER — Other Ambulatory Visit: Payer: Self-pay | Admitting: Physician Assistant

## 2023-07-25 NOTE — Telephone Encounter (Signed)
 Last Fill: 05/18/2023  Labs: 07/05/2023 AST and ALT are borderline elevated. Rest of CMP WNL.  CBC WNL   Next Visit: 11/22/2023  Last Visit: 06/21/2023  DX: Rheumatoid arthritis with rheumatoid factor of multiple sites without organ or systems involvement   Current Dose per office note 06/21/2023: Methotrexate  5 tablets p.o. weekly   Okay to refill Methotrexate ?

## 2023-08-13 ENCOUNTER — Other Ambulatory Visit: Payer: Self-pay | Admitting: Physician Assistant

## 2023-08-14 NOTE — Telephone Encounter (Signed)
 Last Fill: 08/17/2022  Next Visit: 11/22/2023  Last Visit: 06/21/2023  Dx:  Rheumatoid arthritis with rheumatoid factor of multiple sites without organ or systems involvement   Current Dose per office note on 06/21/2023:  folic acid  1 mg p.o. daily.   Okay to refill Folic Acid ?

## 2023-08-23 ENCOUNTER — Telehealth: Payer: Self-pay

## 2023-08-23 NOTE — Telephone Encounter (Signed)
 Patient called the office to see if she could take Berberine and Lysine supplements while taking Methotrexate  ? Please advise

## 2023-08-24 NOTE — Telephone Encounter (Signed)
 No direct interaction between lysine and methotrexate .   Berberine may cause liver enzyme elevation so this may not be recommended with concurrent use of methotrexate .

## 2023-08-24 NOTE — Telephone Encounter (Signed)
 Patient returned call to the office and advised No direct interaction between lysine and methotrexate .   Berberine may cause liver enzyme elevation so this may not be recommended with concurrent use of methotrexate .

## 2023-08-24 NOTE — Telephone Encounter (Signed)
 LMOM for patient to contact the office regarding the supplement she was inquiring about.

## 2023-09-10 ENCOUNTER — Other Ambulatory Visit: Payer: Self-pay | Admitting: Physician Assistant

## 2023-09-18 DIAGNOSIS — U071 COVID-19: Secondary | ICD-10-CM

## 2023-09-18 HISTORY — DX: COVID-19: U07.1

## 2023-11-08 NOTE — Progress Notes (Unsigned)
 Office Visit Note  Patient: Cathy Casey             Date of Birth: [birth year]           MRN: [id number]             PCP: Trinidad Hun, MD Referring: Trinidad Hun, MD Visit Date: 11/22/2023 Occupation: @GUAROCC @  Subjective:  Medication monitoring   History of Present Illness: Cathy Casey is a 63 y.o. female with history of seropositive rheumatoid arthritis and osteoarthritis. Patient remains on Methotrexate  5 tablets by mouth once weekly and folic acid  1 mg by mouth daily.  She has been tolerating methotrexate  without any side effects.  Patient reports that she recently had COVID-19 after traveling on a cruise.  Patient states that she held methotrexate  while recovering from the infection.  She denies any recent rheumatoid arthritis flares.  Patient continues to experience intermittent discomfort in both CMC joints and both knee joints due to underlying osteoarthritis.  Patient would like to consider trying to reduce the dose of methotrexate  back to 3 to 4 tablets weekly since she did not notice any benefit on the increased dose of 5 tablets weekly. Patient had an inadequate response after undergoing Orthovisc injections in April 2025.  She would like to reapply for Euflexxa injections when she is eligible.  She continues to have difficulty climbing steps and getting up from a seated position due to the discomfort in her knee joints.   Activities of Daily Living:  Patient reports morning stiffness for 10 minutes.   Patient Denies nocturnal pain.  Difficulty dressing/grooming: Denies Difficulty climbing stairs: Reports Difficulty getting out of chair: Reports Difficulty using hands for taps, buttons, cutlery, and/or writing: Reports  Review of Systems  Constitutional:  Positive for fatigue.  HENT:  Negative for mouth sores and mouth dryness.   Eyes:  Negative for dryness.  Respiratory:  Negative for shortness of breath.   Cardiovascular:  Negative for chest pain and palpitations.   Gastrointestinal:  Negative for blood in stool, constipation and diarrhea.  Genitourinary:  Positive for involuntary urination.  Musculoskeletal:  Positive for joint pain, gait problem, joint pain and morning stiffness. Negative for joint swelling, myalgias, muscle weakness, muscle tenderness and myalgias.  Skin:  Negative for color change, rash, hair loss and sensitivity to sunlight.  Allergic/Immunologic: Negative for susceptible to infections.  Neurological:  Negative for dizziness and headaches.  Hematological:  Negative for swollen glands.  Psychiatric/Behavioral:  Positive for sleep disturbance. Negative for depressed mood. The patient is not nervous/anxious.     PMFS History:  Patient Active Problem List   Diagnosis Date Noted   Rheumatoid arthritis with rheumatoid factor of multiple sites without organ or systems involvement (HCC) 04/03/2016   High risk medications (not anticoagulants) long-term use 04/03/2016   Primary osteoarthritis of both knees 03/29/2016   Chronic pain of both knees 03/29/2016    Past Medical History:  Diagnosis Date   COVID-19 09/2023   History of diverticulosis    Overactive bladder    Rheumatoid arthritis (HCC)     Family History  Problem Relation Age of Onset   Diabetes Mother    Rheum arthritis Sister    Diabetes Sister    Healthy Son    Healthy Son    Healthy Son    Past Surgical History:  Procedure Laterality Date   ABDOMINAL HYSTERECTOMY     CHOLECYSTECTOMY     COLONOSCOPY  12/20/2022   Social History   Social History Narrative  Not on file   Immunization History  Administered Date(s) Administered   Moderna Sars-Covid-2 Vaccination 05/21/2019, 06/18/2019, 12/12/2019, 05/27/2020     Objective: Vital Signs: BP 132/84 (BP Location: Left Arm, Patient Position: Sitting, Cuff Size: Normal)   Pulse (!) 59   Resp 16   Ht 5' 3 (1.6 m)   Wt 196 lb 9.6 oz (89.2 kg)   BMI 34.83 kg/m    Physical Exam Vitals and nursing note  reviewed.  Constitutional:      Appearance: She is well-developed.  HENT:     Head: Normocephalic and atraumatic.  Eyes:     Conjunctiva/sclera: Conjunctivae normal.  Cardiovascular:     Rate and Rhythm: Normal rate and regular rhythm.     Heart sounds: Normal heart sounds.  Pulmonary:     Effort: Pulmonary effort is normal.     Breath sounds: Normal breath sounds.  Abdominal:     General: Bowel sounds are normal.     Palpations: Abdomen is soft.  Musculoskeletal:     Cervical back: Normal range of motion.  Lymphadenopathy:     Cervical: No cervical adenopathy.  Skin:    General: Skin is warm and dry.     Capillary Refill: Capillary refill takes less than 2 seconds.  Neurological:     Mental Status: She is alert and oriented to person, place, and time.  Psychiatric:        Behavior: Behavior normal.      Musculoskeletal Exam: C-spine, thoracic spine, lumbar spine have good range of motion.  Shoulder joints, elbow joints, wrist joints, MCPs, PIPs, DIPs have good range of motion with no synovitis.  Complete fist formation bilaterally.  Tenderness over both CMC joints and over the first MCP joint bilaterally.  Hip joints have good range of motion with no groin pain.  Knee joints have good range of motion no warmth or effusion.  Ankle joints have good range of motion with no tenderness or joint swelling.  CDAI Exam: CDAI Score: -- Patient Global: --; Provider Global: -- Swollen: --; Tender: -- Joint Exam 11/22/2023   No joint exam has been documented for this visit   There is currently no information documented on the homunculus. Go to the Rheumatology activity and complete the homunculus joint exam.  Investigation: No additional findings.  Imaging: No results found.  Recent Labs: Lab Results  Component Value Date   WBC 6.5 07/05/2023   HGB 13.0 07/05/2023   PLT 305 07/05/2023   NA 142 07/05/2023   K 4.4 07/05/2023   CL 108 07/05/2023   CO2 27 07/05/2023    GLUCOSE 79 07/05/2023   BUN 11 07/05/2023   CREATININE 0.75 07/05/2023   BILITOT 0.7 07/05/2023   ALKPHOS 105 04/17/2023   AST 36 (H) 07/05/2023   ALT 31 (H) 07/05/2023   PROT 6.4 07/05/2023   ALBUMIN 4.2 04/17/2023   CALCIUM 9.2 07/05/2023   GFRAA 96 09/06/2020    Speciality Comments: No specialty comments available.  Procedures:  No procedures performed Allergies: Patient has no known allergies.   Assessment / Plan:     Visit Diagnoses: Rheumatoid arthritis with rheumatoid factor of multiple sites without organ or systems involvement [facility]): She has no synovitis on examination today.  She has not had any signs or symptoms of a rheumatoid arthritis flare.  She continues to experience intermittent discomfort in both CMC joints and both knee joints due to underlying osteoarthritis.  She has been taking methotrexate  5 tablets by mouth once  weekly and folic acid  1 mg daily.  She has not noticed any benefit since increasing the dose of methotrexate  from 3 tablets to 5 tablets weekly and would like to consider reducing the dose as tolerated. She would also like to reapply for Euflexxa injections for both knees once eligible since she had an inadequate response to Orthovisc injections in April 2025. She has been trying to go to the gym 4 days a week and getting in the pool for exercise but continues to have difficulty climbing steps and rising from a seated position due to discomfort in both knees. Patient plans on trying to reduce methotrexate  to 4 tablets weekly and will continue taking folic acid  1 mg daily.  She will notify us  if she develops any new or worsening symptoms.  She will follow-up in the office in 5 months or sooner if needed.  High risk medication use - Methotrexate  5 tablets by mouth once weekly and folic acid  1 mg p.o. daily.  CBC and CMP updated on 07/05/23. Orders for CBC and CMP released today.  Lipid panel updated on 04/17/23  Discussed the importance of holding methotrexate   if she develops signs or symptoms of an infection and to resume once the infection has completely cleared.  - Plan: CBC with Differential/Platelet, Comprehensive metabolic panel with GFR  Primary osteoarthritis of both knees -She continues to experience intermittent discomfort and stiffness in both knee joints.  She has difficulty climbing steps and rising from a seated position due to the discomfort. No warmth or effusion noted today.  Patient underwent Orthovisc injections for both knees in April 2025 but had an inadequate response.  She previously had significant relief after undergoing Euflexxa injections for both knees.  She would like to reapply for Euflexxa injections for both knees once eligible.    This patient is diagnosed with osteoarthritis of the knee(s).    Radiographs show evidence of joint space narrowing, osteophytes, subchondral sclerosis and/or subchondral cysts.  This patient has knee pain which interferes with functional and activities of daily living.    This patient has experienced inadequate response, adverse effects and/or intolerance with conservative treatments such as acetaminophen, NSAIDS, topical creams, physical therapy or regular exercise, knee bracing and/or weight loss.   This patient has experienced inadequate response or has a contraindication to intra articular steroid injections for at least 3 months.   This patient is not scheduled to have a total knee replacement within 6 months of starting treatment with viscosupplementation.  Primary osteoarthritis of both feet: She has good range of motion of both ankle joints with no tenderness or joint swelling.  Plantar fasciitis: Not currently symptomatic.  Spondylosis of lumbar spine: Intermittent discomfort.  Other medical conditions are listed as follows:  Overactive bladder  Trochanteric bursitis, left hip:   Other fatigue  Orders: Orders Placed This Encounter  Procedures   CBC with  Differential/Platelet   Comprehensive metabolic panel with GFR   Meds ordered this encounter  Medications   methotrexate  (RHEUMATREX) 2.5 MG tablet    Sig: TAKE 5 TABLETS BY MOUTH 1 TIME WEEKLY    Dispense:  60 tablet    Refill:  0    Follow-Up Instructions: Return in about 5 months (around 04/23/2024) for Rheumatoid arthritis.   Waddell CHRISTELLA Craze, PA-C  Note - This record has been created using Dragon software.  Chart creation errors have been sought, but may not always  have been located. Such creation errors do not reflect on  the standard  of medical care.

## 2023-11-22 ENCOUNTER — Encounter: Payer: Self-pay | Admitting: Physician Assistant

## 2023-11-22 ENCOUNTER — Ambulatory Visit: Attending: Physician Assistant | Admitting: Physician Assistant

## 2023-11-22 VITALS — BP 132/84 | HR 59 | Resp 16 | Ht 63.0 in | Wt 196.6 lb

## 2023-11-22 DIAGNOSIS — M19071 Primary osteoarthritis, right ankle and foot: Secondary | ICD-10-CM

## 2023-11-22 DIAGNOSIS — M170 Bilateral primary osteoarthritis of knee: Secondary | ICD-10-CM

## 2023-11-22 DIAGNOSIS — Z79899 Other long term (current) drug therapy: Secondary | ICD-10-CM | POA: Diagnosis not present

## 2023-11-22 DIAGNOSIS — M19072 Primary osteoarthritis, left ankle and foot: Secondary | ICD-10-CM

## 2023-11-22 DIAGNOSIS — M47816 Spondylosis without myelopathy or radiculopathy, lumbar region: Secondary | ICD-10-CM

## 2023-11-22 DIAGNOSIS — M722 Plantar fascial fibromatosis: Secondary | ICD-10-CM

## 2023-11-22 DIAGNOSIS — N3281 Overactive bladder: Secondary | ICD-10-CM

## 2023-11-22 DIAGNOSIS — M0579 Rheumatoid arthritis with rheumatoid factor of multiple sites without organ or systems involvement: Secondary | ICD-10-CM | POA: Diagnosis not present

## 2023-11-22 DIAGNOSIS — M7062 Trochanteric bursitis, left hip: Secondary | ICD-10-CM

## 2023-11-22 DIAGNOSIS — R5383 Other fatigue: Secondary | ICD-10-CM

## 2023-11-22 MED ORDER — METHOTREXATE SODIUM 2.5 MG PO TABS: ORAL_TABLET | ORAL | 0 refills | Status: DC

## 2023-11-22 NOTE — Patient Instructions (Signed)

## 2023-11-23 ENCOUNTER — Ambulatory Visit: Payer: Self-pay | Admitting: Physician Assistant

## 2023-11-23 LAB — COMPREHENSIVE METABOLIC PANEL WITH GFR
AG Ratio: 1.7 (calc) (ref 1.0–2.5)
ALT: 18 U/L (ref 6–29)
AST: 18 U/L (ref 10–35)
Albumin: 4 g/dL (ref 3.6–5.1)
Alkaline phosphatase (APISO): 91 U/L (ref 37–153)
BUN: 12 mg/dL (ref 7–25)
CO2: 30 mmol/L (ref 20–32)
Calcium: 9.4 mg/dL (ref 8.6–10.4)
Chloride: 105 mmol/L (ref 98–110)
Creat: 0.81 mg/dL (ref 0.50–1.05)
Globulin: 2.4 g/dL (ref 1.9–3.7)
Glucose, Bld: 85 mg/dL (ref 65–99)
Potassium: 5.2 mmol/L (ref 3.5–5.3)
Sodium: 140 mmol/L (ref 135–146)
Total Bilirubin: 0.4 mg/dL (ref 0.2–1.2)
Total Protein: 6.4 g/dL (ref 6.1–8.1)
eGFR: 82 mL/min/1.73m2 (ref >=60)

## 2023-11-23 LAB — CBC WITH DIFFERENTIAL/PLATELET
Absolute Lymphocytes: 1903 {cells}/uL (ref 850–3900)
Absolute Monocytes: 878 {cells}/uL (ref 200–950)
Basophils Absolute: 54 {cells}/uL (ref 0–200)
Basophils Relative: 0.8 %
Eosinophils Absolute: 161 {cells}/uL (ref 15–500)
Eosinophils Relative: 2.4 %
HCT: 39.7 % (ref 35.0–45.0)
Hemoglobin: 12.7 g/dL (ref 11.7–15.5)
MCH: 29.5 pg (ref 27.0–33.0)
MCHC: 32 g/dL (ref 32.0–36.0)
MCV: 92.1 fL (ref 80.0–100.0)
MPV: 11.2 fL (ref 7.5–12.5)
Monocytes Relative: 13.1 %
Neutro Abs: 3705 {cells}/uL (ref 1500–7800)
Neutrophils Relative %: 55.3 %
Platelets: 290 Thousand/uL (ref 140–400)
RBC: 4.31 Million/uL (ref 3.80–5.10)
RDW: 13.3 % (ref 11.0–15.0)
Total Lymphocyte: 28.4 %
WBC: 6.7 Thousand/uL (ref 3.8–10.8)

## 2023-11-23 NOTE — Progress Notes (Signed)
 CBC and CMP WNL

## 2024-01-29 ENCOUNTER — Encounter: Payer: Self-pay | Admitting: Podiatry

## 2024-01-29 ENCOUNTER — Ambulatory Visit: Admitting: Podiatry

## 2024-01-29 ENCOUNTER — Ambulatory Visit (INDEPENDENT_AMBULATORY_CARE_PROVIDER_SITE_OTHER)

## 2024-01-29 DIAGNOSIS — M722 Plantar fascial fibromatosis: Secondary | ICD-10-CM

## 2024-01-29 DIAGNOSIS — M069 Rheumatoid arthritis, unspecified: Secondary | ICD-10-CM

## 2024-01-29 DIAGNOSIS — G609 Hereditary and idiopathic neuropathy, unspecified: Secondary | ICD-10-CM

## 2024-01-29 MED ORDER — GABAPENTIN 100 MG PO CAPS
100.0000 mg | ORAL_CAPSULE | Freq: Three times a day (TID) | ORAL | 0 refills | Status: AC
Start: 2024-01-29 — End: ?

## 2024-01-29 NOTE — Patient Instructions (Signed)

## 2024-01-29 NOTE — Progress Notes (Unsigned)
  Subjective:  Patient ID: Cathy Casey, female    DOB: 1960-09-30,  MRN: [id number]  Chief Complaint  Patient presents with   Foot Pain    Bilateral foot pain x 6 months. Worse at night feet tingle, burn and tight.  Not diabetic. No anti coag.    Discussed the use of AI scribe software for clinical note transcription with the patient, who gave verbal consent to proceed.  History of Present Illness Cathy Casey is a 63 year old female with rheumatoid arthritis who presents with bilateral foot pain.  She experiences bilateral foot and ankle pain, described as hot, tingly, and tight, which worsens at night and disrupts her sleep. The pain intensifies after activities such as walking or shopping. Numbness and tingling sensations occur, particularly at night, with occasional weakness in her ankles. She takes methotrexate  for rheumatoid arthritis and B12 supplements daily. She has not tried any medications specifically for neuropathy. No shooting sensations towards toes when tapping along the nerve. She has gained weight recently, which she believes has exacerbated her symptoms.      Objective:    Physical Exam MUSCULOSKELETAL: Tightness in hips and posterior knee with single leg raise.  Muscle strength 5/5 in dorsiflexion, plantarflexion, inversion, eversion.  Mild tenderness on palpation of plantar heel. NEUROLOGICAL: Protective sensation and vibratory sensation intact in feet and toes.  Light touch sensation decreased.  Subjective paresthesias noted.  Negative Tinel's sign. DERMATOLOGIC: Pedal skin well-hydrated.  No open wounds or lesions noted VASCULAR: DP and PT pulses palpable 2/4 bilaterally.   No images are attached to the encounter.    Results RADIOLOGY Left and right foot X-ray: Normal osseous mineralization.  Joint spaces largely preserved.  Plantar heel spur noted left foot and right foot, unchanged from previous overall.  No acute fractures.  No acute osseous abnormalities.    Assessment:   1. Idiopathic peripheral neuropathy   2. Rheumatoid arthritis involving both feet, unspecified whether rheumatoid factor present [facility])      Plan:  Patient was evaluated and treated and all questions answered.  Assessment and Plan Assessment & Plan Peripheral neuropathy of bilateral feet Bilateral foot pain with tingling, numbness, and tightness, possibly related to rheumatoid arthritis. Differential includes neuropathy, sciatica, and muscle tightness. Protective and vibratory sensations intact. Considered gabapentin for symptom management. - Initiated gabapentin 100 mg three times a day. - Reassess in 3-4 weeks for tolerance and effectiveness. - Consider neurologist referral if symptoms persist or worsen. - Provided stretching exercises for mobility.  Rheumatoid arthritis Chronic pain in hands and feet, possibly contributing to current symptoms.  Taking methotrexate  as prescribed  Has dealt with plantar fasciitis symptoms in the past.  Encouraged continued stretching and use of good supportive shoe gear.      Return in about 4 weeks (around 02/26/2024) for peripheral neuropathy.

## 2024-02-22 ENCOUNTER — Telehealth: Payer: Self-pay | Admitting: Rheumatology

## 2024-02-22 ENCOUNTER — Other Ambulatory Visit: Payer: Self-pay | Admitting: Physician Assistant

## 2024-02-22 DIAGNOSIS — Z79899 Other long term (current) drug therapy: Secondary | ICD-10-CM

## 2024-02-22 NOTE — Telephone Encounter (Signed)
 Last Fill: 11/22/2023  Labs: 11/22/2023 CBC and CMP WNL    Patient contacted the office requesting lab orders to be be release to [facility] .    Patient plans to have labs on Monday.    Next Visit: 04/30/2024  Last Visit: 11/22/2023  DX: Rheumatoid arthritis with rheumatoid factor of multiple sites without organ or systems involvement   Current Dose per office note 11/22/2023: Methotrexate  5 tablets by mouth once weekly   Okay to refill Methotrexate ?

## 2024-02-22 NOTE — Telephone Encounter (Signed)
 Last Fill: 11/22/2023  Labs: 11/22/2023 CBC and CMP WNL   Next Visit: 05/01/2023  Last Visit: 11/22/2023  DX:  Rheumatoid arthritis with rheumatoid factor of multiple sites without organ or systems involvement   Current Dose per office note 11/22/2023: Methotrexate  5 tablets by mouth once weekly   Patient to update labs on 02/25/2024  Okay to refill Methotrexate ?

## 2024-02-22 NOTE — Telephone Encounter (Signed)
 Lab Orders released.

## 2024-02-22 NOTE — Telephone Encounter (Signed)
 Patient contacted the office requesting lab orders to be be release to [facility] .   Patient plans to have labs on Monday.

## 2024-02-25 ENCOUNTER — Other Ambulatory Visit: Payer: Self-pay | Admitting: Podiatry

## 2024-02-25 DIAGNOSIS — G609 Hereditary and idiopathic neuropathy, unspecified: Secondary | ICD-10-CM

## 2024-02-26 ENCOUNTER — Ambulatory Visit: Payer: Self-pay | Admitting: Rheumatology

## 2024-02-26 ENCOUNTER — Ambulatory Visit: Admitting: Podiatry

## 2024-02-26 LAB — CMP14+EGFR
ALT: 27 IU/L (ref 0–32)
AST: 25 IU/L (ref 0–40)
Albumin: 3.9 g/dL (ref 3.9–4.9)
Alkaline Phosphatase: 109 IU/L (ref 49–135)
BUN/Creatinine Ratio: 18 (ref 12–28)
BUN: 14 mg/dL (ref 8–27)
Bilirubin Total: 0.3 mg/dL (ref 0.0–1.2)
CO2: 23 mmol/L (ref 20–29)
Calcium: 9.2 mg/dL (ref 8.7–10.3)
Chloride: 104 mmol/L (ref 96–106)
Creatinine, Ser: 0.8 mg/dL (ref 0.57–1.00)
Globulin, Total: 2.1 g/dL (ref 1.5–4.5)
Glucose: 88 mg/dL (ref 70–99)
Potassium: 4.4 mmol/L (ref 3.5–5.2)
Sodium: 137 mmol/L (ref 134–144)
Total Protein: 6 g/dL (ref 6.0–8.5)
eGFR: 83 mL/min/1.73 (ref >59)

## 2024-02-26 LAB — CBC WITH DIFFERENTIAL/PLATELET
Basophils Absolute: 0.1 x10E3/uL (ref 0.0–0.2)
Basos: 1 %
EOS (ABSOLUTE): 0.2 x10E3/uL (ref 0.0–0.4)
Eos: 2 %
Hematocrit: 39.6 % (ref 34.0–46.6)
Hemoglobin: 12.8 g/dL (ref 11.1–15.9)
Immature Grans (Abs): 0 x10E3/uL (ref 0.0–0.1)
Immature Granulocytes: 0 %
Lymphocytes Absolute: 1.8 x10E3/uL (ref 0.7–3.1)
Lymphs: 20 %
MCH: 29.8 pg (ref 26.6–33.0)
MCHC: 32.3 g/dL (ref 31.5–35.7)
MCV: 92 fL (ref 79–97)
Monocytes Absolute: 0.8 x10E3/uL (ref 0.1–0.9)
Monocytes: 9 %
Neutrophils Absolute: 6 x10E3/uL (ref 1.4–7.0)
Neutrophils: 68 %
Platelets: 312 x10E3/uL (ref 150–450)
RBC: 4.3 x10E6/uL (ref 3.77–5.28)
RDW: 13.3 % (ref 11.7–15.4)
WBC: 8.8 x10E3/uL (ref 3.4–10.8)

## 2024-03-24 ENCOUNTER — Other Ambulatory Visit: Payer: Self-pay | Admitting: Rheumatology

## 2024-03-24 NOTE — Telephone Encounter (Signed)
 Last Fill: 02/22/2024 (30 day supply)  Labs: 02/25/2024 CBC and CMP WNL   Next Visit: 04/30/2024  Last Visit: 11/22/2023  DX: Rheumatoid arthritis with rheumatoid factor of multiple sites without organ or systems involvement   Current Dose per office note on 11/22/2023: Methotrexate  5 tablets by mouth once weekly   Okay to refill Methotrexate ?

## 2024-04-15 NOTE — Progress Notes (Unsigned)
 "  Office Visit Note  Patient: Cathy Casey             Date of Birth: [birth year]           MRN: [id number]             PCP: Trinidad Hun, MD Referring: Trinidad Hun, MD Visit Date: 04/28/2024 Occupation: Data Unavailable  Subjective:  No chief complaint on file.   History of Present Illness: Falana Clagg is a 64 y.o. female ***     Activities of Daily Living:  Patient reports morning stiffness for *** {minute/hour:19697}.   Patient {ACTIONS;DENIES/REPORTS:21021675::Denies} nocturnal pain.  Difficulty dressing/grooming: {ACTIONS;DENIES/REPORTS:21021675::Denies} Difficulty climbing stairs: {ACTIONS;DENIES/REPORTS:21021675::Denies} Difficulty getting out of chair: {ACTIONS;DENIES/REPORTS:21021675::Denies} Difficulty using hands for taps, buttons, cutlery, and/or writing: {ACTIONS;DENIES/REPORTS:21021675::Denies}  No Rheumatology ROS completed.   PMFS History:  Patient Active Problem List   Diagnosis Date Noted   Rheumatoid arthritis with rheumatoid factor of multiple sites without organ or systems involvement (HCC) 04/03/2016   High risk medications (not anticoagulants) long-term use 04/03/2016   Primary osteoarthritis of both knees 03/29/2016   Chronic pain of both knees 03/29/2016    Past Medical History:  Diagnosis Date   COVID-19 09/2023   History of diverticulosis    Overactive bladder    Rheumatoid arthritis (HCC)     Family History  Problem Relation Age of Onset   Diabetes Mother    Rheum arthritis Sister    Diabetes Sister    Healthy Son    Healthy Son    Healthy Son    Past Surgical History:  Procedure Laterality Date   ABDOMINAL HYSTERECTOMY     CHOLECYSTECTOMY     COLONOSCOPY  12/20/2022   Social History[1] Social History   Social History Narrative   Not on file     Immunization History  Administered Date(s) Administered   Moderna Sars-Covid-2 Vaccination 05/21/2019, 06/18/2019, 12/12/2019, 05/27/2020     Objective: Vital Signs:  There were no vitals taken for this visit.   Physical Exam   Musculoskeletal Exam: ***  CDAI Exam: CDAI Score: -- Patient Global: --; Provider Global: -- Swollen: --; Tender: -- Joint Exam 04/28/2024   No joint exam has been documented for this visit   There is currently no information documented on the homunculus. Go to the Rheumatology activity and complete the homunculus joint exam.  Investigation: No additional findings.  Imaging: No results found.  Recent Labs: Lab Results  Component Value Date   WBC 8.8 02/25/2024   HGB 12.8 02/25/2024   PLT 312 02/25/2024   NA 137 02/25/2024   K 4.4 02/25/2024   CL 104 02/25/2024   CO2 23 02/25/2024   GLUCOSE 88 02/25/2024   BUN 14 02/25/2024   CREATININE 0.80 02/25/2024   BILITOT 0.3 02/25/2024   ALKPHOS 109 02/25/2024   AST 25 02/25/2024   ALT 27 02/25/2024   PROT 6.0 02/25/2024   ALBUMIN 3.9 02/25/2024   CALCIUM 9.2 02/25/2024   GFRAA 96 09/06/2020    Speciality Comments: No specialty comments available.  Procedures:  No procedures performed Allergies: Patient has no known allergies.   Assessment / Plan:     Visit Diagnoses: No diagnosis found.  Orders: No orders of the defined types were placed in this encounter.  No orders of the defined types were placed in this encounter.   Face-to-face time spent with patient was *** minutes. Greater than 50% of time was spent in counseling and coordination of care.  Follow-Up Instructions: No follow-ups on  file.   Daved JAYSON Gavel, CMA  Note - This record has been created using Animal nutritionist.  Chart creation errors have been sought, but may not always  have been located. Such creation errors do not reflect on  the standard of medical care.    [1]  Social History Tobacco Use   Smoking status: Never    Passive exposure: Current   Smokeless tobacco: Never  Vaping Use   Vaping status: Never Used  Substance Use Topics   Alcohol use: No   Drug use: No   "

## 2024-04-28 ENCOUNTER — Ambulatory Visit: Admitting: Rheumatology

## 2024-04-28 DIAGNOSIS — M170 Bilateral primary osteoarthritis of knee: Secondary | ICD-10-CM

## 2024-04-28 DIAGNOSIS — M0579 Rheumatoid arthritis with rheumatoid factor of multiple sites without organ or systems involvement: Secondary | ICD-10-CM

## 2024-04-28 DIAGNOSIS — M19071 Primary osteoarthritis, right ankle and foot: Secondary | ICD-10-CM

## 2024-04-28 DIAGNOSIS — M7062 Trochanteric bursitis, left hip: Secondary | ICD-10-CM

## 2024-04-28 DIAGNOSIS — Z79899 Other long term (current) drug therapy: Secondary | ICD-10-CM

## 2024-04-28 DIAGNOSIS — N3281 Overactive bladder: Secondary | ICD-10-CM

## 2024-04-28 DIAGNOSIS — M722 Plantar fascial fibromatosis: Secondary | ICD-10-CM

## 2024-04-28 DIAGNOSIS — R5383 Other fatigue: Secondary | ICD-10-CM

## 2024-04-28 DIAGNOSIS — M47816 Spondylosis without myelopathy or radiculopathy, lumbar region: Secondary | ICD-10-CM

## 2024-04-30 ENCOUNTER — Ambulatory Visit: Admitting: Rheumatology
